# Patient Record
Sex: Male | Born: 1950 | Race: White | Hispanic: No | Marital: Single | Smoking: Current some day smoker
Health system: Southern US, Community
[De-identification: ages and names within clinical notes are randomized; demographics above are authoritative.]

## PROBLEM LIST (undated history)

## (undated) DIAGNOSIS — S32009A Unspecified fracture of unspecified lumbar vertebra, initial encounter for closed fracture: Secondary | ICD-10-CM

## (undated) HISTORY — PX: COLONOSCOPY: SHX174

## (undated) HISTORY — PX: VASECTOMY: SHX75

## (undated) HISTORY — PX: OTHER SURGICAL HISTORY: SHX169

## (undated) HISTORY — PX: HERNIA REPAIR: SHX51

## (undated) HISTORY — DX: Unspecified fracture of unspecified lumbar vertebra, initial encounter for closed fracture: S32.009A

---

## 1999-05-23 ENCOUNTER — Emergency Department (HOSPITAL_COMMUNITY): Admission: EM | Admit: 1999-05-23 | Discharge: 1999-05-23 | Payer: Self-pay | Admitting: Emergency Medicine

## 1999-05-24 ENCOUNTER — Ambulatory Visit (HOSPITAL_COMMUNITY): Admission: RE | Admit: 1999-05-24 | Discharge: 1999-05-24 | Payer: Self-pay

## 2001-12-20 ENCOUNTER — Encounter: Payer: Self-pay | Admitting: Family Medicine

## 2001-12-20 ENCOUNTER — Ambulatory Visit (HOSPITAL_COMMUNITY): Admission: RE | Admit: 2001-12-20 | Discharge: 2001-12-20 | Payer: Self-pay | Admitting: Family Medicine

## 2002-07-17 ENCOUNTER — Encounter (INDEPENDENT_AMBULATORY_CARE_PROVIDER_SITE_OTHER): Payer: Self-pay | Admitting: *Deleted

## 2002-07-17 ENCOUNTER — Ambulatory Visit (HOSPITAL_BASED_OUTPATIENT_CLINIC_OR_DEPARTMENT_OTHER): Admission: RE | Admit: 2002-07-17 | Discharge: 2002-07-17 | Payer: Self-pay

## 2004-02-04 ENCOUNTER — Emergency Department (HOSPITAL_COMMUNITY): Admission: EM | Admit: 2004-02-04 | Discharge: 2004-02-05 | Payer: Self-pay | Admitting: Emergency Medicine

## 2004-02-20 ENCOUNTER — Ambulatory Visit (HOSPITAL_COMMUNITY): Admission: RE | Admit: 2004-02-20 | Discharge: 2004-02-20 | Payer: Self-pay

## 2004-11-09 ENCOUNTER — Ambulatory Visit: Payer: Self-pay | Admitting: Internal Medicine

## 2004-12-29 ENCOUNTER — Ambulatory Visit: Payer: Self-pay | Admitting: Internal Medicine

## 2004-12-30 ENCOUNTER — Ambulatory Visit: Payer: Self-pay | Admitting: Internal Medicine

## 2009-03-24 ENCOUNTER — Inpatient Hospital Stay (HOSPITAL_COMMUNITY): Admission: EM | Admit: 2009-03-24 | Discharge: 2009-03-28 | Payer: Self-pay | Admitting: Emergency Medicine

## 2010-10-09 LAB — CBC
MCHC: 34.4 g/dL (ref 30.0–36.0)
MCHC: 34.7 g/dL (ref 30.0–36.0)
MCV: 91.3 fL (ref 78.0–100.0)
Platelets: 174 10*3/uL (ref 150–400)
Platelets: 190 10*3/uL (ref 150–400)
RBC: 4.08 MIL/uL — ABNORMAL LOW (ref 4.22–5.81)
RDW: 12.8 % (ref 11.5–15.5)
RDW: 12.9 % (ref 11.5–15.5)
WBC: 8.3 10*3/uL (ref 4.0–10.5)
WBC: 8.6 10*3/uL (ref 4.0–10.5)

## 2010-10-09 LAB — BASIC METABOLIC PANEL
BUN: 13 mg/dL (ref 6–23)
BUN: 16 mg/dL (ref 6–23)
CO2: 27 mEq/L (ref 19–32)
CO2: 27 mEq/L (ref 19–32)
Calcium: 8.9 mg/dL (ref 8.4–10.5)
Calcium: 8.9 mg/dL (ref 8.4–10.5)
Calcium: 9.1 mg/dL (ref 8.4–10.5)
Chloride: 105 mEq/L (ref 96–112)
Creatinine, Ser: 0.64 mg/dL (ref 0.4–1.5)
Creatinine, Ser: 0.73 mg/dL (ref 0.4–1.5)
GFR calc Af Amer: >60 mL/min (ref >60)
GFR calc non Af Amer: >60 mL/min (ref >60)
GFR calc non Af Amer: >60 mL/min (ref >60)
Glucose, Bld: 100 mg/dL — ABNORMAL HIGH (ref 70–99)
Glucose, Bld: 81 mg/dL (ref 70–99)
Potassium: 4.1 mEq/L (ref 3.5–5.1)
Sodium: 138 mEq/L (ref 135–145)

## 2010-10-09 LAB — PROTIME-INR: INR: 1 (ref 0.00–1.49)

## 2010-10-09 LAB — GLUCOSE, CAPILLARY: Glucose-Capillary: 79 mg/dL (ref 70–99)

## 2010-11-20 NOTE — Op Note (Signed)
NAME:  Joe Shannon, Joe Shannon                         ACCOUNT NO.:  [id number]   MEDICAL RECORD NO.:  [id number]                   PATIENT TYPE:  AMB   LOCATION:  DSC                                  FACILITY:  [facility]   PHYSICIAN:  Lorre Munroe., M.D.            DATE OF BIRTH:  1951/06/10   DATE OF PROCEDURE:  07/17/2002  DATE OF DISCHARGE:                                 OPERATIVE REPORT   PREOPERATIVE DIAGNOSIS:  Right inguinal hernia.   POSTOPERATIVE DIAGNOSES:  1. Indirect right inguinal hernia.  2. Hydrocele of the spermatic cord.   PROCEDURES:  1. Repair of right inguinal hernia.  2. Excision of hydrocele.   SURGEON:  Lebron Conners, M.D.   ANESTHESIA:  Local with sedation.   DESCRIPTION OF PROCEDURE:  After the patient was moderately sedated and had  routine preparation and draping of the right inguinal region, I liberally  infused local anesthetic in the area of the ilioinguinal nerve and in the  entire field.  I used more local anesthetic as I deepened my dissection, and  the patient evidently was comfortable throughout the procedure.  I made an  oblique skin incision beginning just above and lateral to the pubic tubercle  approximately 6 cm long, dissected down through the fat, getting hemostasis  with the Bovie, until I identified the external ring.  I opened the fibers  of the external oblique in through the external ring, taking great care to  avoid injury to the ilioinguinal and iliohypogastric nerve.  I exposed the  spermatic cord, which had a mass in the superior aspect of it.  I cut  cremaster fibers until I was able to encircle the cord with a Penrose drain,  and I elevated it and controlled it.  I then noted that there was no  evidence of a direct hernia.  I dissected the cord longitudinally toward the  mass, and I separated it from the vas deferens and the cord vessels.  It  appeared to be a fluid-filled sac, which was attached to tissues going up  through  the internal ring.  After dissecting it free of the vessels, I  clamped it and excised it and suture ligated the pedicle that it was on.  I  used 2-0 silk for the ligature.  I sent the specimen to the lab for  examination.  I felt that the internal ring was slightly lax, so I plugged  it with a small plug of polypropylene mesh.  I then fashioned a patch of  polypropylene mesh cut to fit the inguinal floor and a slit in it to allow  egress of the spermatic cord.  I sewed that in from the pubic tubercle  laterally and inferiorly with a running stitch in the inguinal ligament and  superiorly and medially with a running basting stitch in the fascia of the  internal oblique muscle.  I used 2-0 Prolene suture.  I used one stitch to  join the tails of the mesh together lateral to the spermatic cord and felt  that provided a very secure hernia repair.  I then infused a little bit more  local anesthetic and closed the external oblique and subcutaneous tissues  with separate layers of running 3-0 Vicryl suture.  I closed the skin with  intracuticular 4-0 Vicryl and Steri-Strips.  The patient tolerated the  operation well.                                                Lorre Munroe., M.D.   WB/MEDQ  D:  07/17/2002  T:  07/17/2002  Job:  [id number]

## 2010-11-20 NOTE — Op Note (Signed)
NAME:  Joe Shannon, OBERMAN                         ACCOUNT NO.:  [id number]   MEDICAL RECORD NO.:  [id number]                   PATIENT TYPE:  AMB   LOCATION:  DAY                                  FACILITY:  [facility]   PHYSICIAN:  Lorre Munroe., M.D.            DATE OF BIRTH:  02/14/1951   DATE OF PROCEDURE:  02/20/2004  DATE OF DISCHARGE:                                 OPERATIVE REPORT   PREOPERATIVE DIAGNOSES:  Indirect left inguinal hernia.   POSTOPERATIVE DIAGNOSES:  Indirect left inguinal hernia.   OPERATION:  Repair of left inguinal hernia.   SURGEON:  Lebron Conners, M.D.   ANESTHESIA:  General and local.   DESCRIPTION OF PROCEDURE:  After the patient was monitored and anesthetized  and had routine preparation and draping of the left lower abdomen, I made a  slightly oblique 5-6 cm incision from just above the pubic tubercle  laterally.  I dissected down through the fat until I encountered the  external oblique and identified the superficial inguinal ring. I opened the  external oblique into the superficial ring in the direction of its fibers.  There was a great deal of scar reaction present and it was difficult to  separate the ilioinguinal nerve from the scar. I made efforts to preserve  the ilioinguinal nerve but could not definitely see it throughout its course  along the cord and out into the soft tissues. After opening the ring, I  dissected the cord free of the underlying inguinal floor and controlled it  with a Penrose drain. The proximal part of the cord was greatly expanded and  there was an obvious indirect hernia.  The medial inguinal floor looked  strong and I did not think there was a direct hernia.  I dissected the  proximal part of the cord separating a large indirect hernia sac which again  was extremely scarred and very chronic in nature.  I separated it from the  vas deferens and cord vessels. I had a little bit of difficulty completely  dissecting it  free and dissecting into it at a couple of points and found  that it was a sliding hernia but I could not tell exactly which viscus was  making up part of the wall.  In any case after dissecting it free, I closed  the peritoneal defect with running 2-0 silk stitch and then reduced the  hernia sac and its contents through the deep ring. I also dissected up a  large lipoma and reduced it through the deep ring as well. I plugged the  superior aspect of the deep ring with a plug of polypropylene mesh held in  by 2-0 silk stitch.  I then fashioned a patch of polypropylene mesh and  sewed it in from the pubic tubercle medially and superiorly along the  internal oblique fascia with a running basting 2-0 Prolene stitch and  inferiorly and laterally with a  running 2-0 Prolene stitch in the shelving  edge of the inguinal ligament. I made a slit in the patch to allow exit of  the spermatic cord and joined the tails of the mesh together lateral to the  deep ring completing the repair.  I got good hemostasis with the cautery and  I thoroughly anesthetized the operative site and  blocked the ilioinguinal nerve. I closed the internal oblique and  subcutaneous tissues with separate running layers of 3-0 Vicryl and closed  the skin with intracuticular 4-0 Vicryl and Steri-Strips.  The patient  tolerated the operation well.  Sponge, needle and instrument counts were  correct.                                               Lorre Munroe., M.D.    WB/MEDQ  D:  02/20/2004  T:  02/20/2004  Job:  [id number]   cc:   Stacie Acres. White, M.D.  [address]  [place]  [place] [address]  Fax: [phone]

## 2015-07-16 DIAGNOSIS — K648 Other hemorrhoids: Secondary | ICD-10-CM | POA: Diagnosis not present

## 2015-07-16 DIAGNOSIS — D128 Benign neoplasm of rectum: Secondary | ICD-10-CM | POA: Diagnosis not present

## 2015-07-16 DIAGNOSIS — D126 Benign neoplasm of colon, unspecified: Secondary | ICD-10-CM | POA: Diagnosis not present

## 2015-07-16 DIAGNOSIS — K621 Rectal polyp: Secondary | ICD-10-CM | POA: Diagnosis not present

## 2015-07-16 DIAGNOSIS — Z800 Family history of malignant neoplasm of digestive organs: Secondary | ICD-10-CM | POA: Diagnosis not present

## 2015-07-16 DIAGNOSIS — Z86010 Personal history of colonic polyps: Secondary | ICD-10-CM | POA: Diagnosis not present

## 2015-07-16 DIAGNOSIS — D123 Benign neoplasm of transverse colon: Secondary | ICD-10-CM | POA: Diagnosis not present

## 2016-05-15 DIAGNOSIS — R69 Illness, unspecified: Secondary | ICD-10-CM | POA: Diagnosis not present

## 2016-06-09 DIAGNOSIS — R69 Illness, unspecified: Secondary | ICD-10-CM | POA: Diagnosis not present

## 2016-06-09 DIAGNOSIS — I10 Essential (primary) hypertension: Secondary | ICD-10-CM | POA: Diagnosis not present

## 2016-06-09 DIAGNOSIS — Z0000 Encounter for general adult medical examination without abnormal findings: Secondary | ICD-10-CM | POA: Diagnosis not present

## 2016-06-15 DIAGNOSIS — I10 Essential (primary) hypertension: Secondary | ICD-10-CM | POA: Diagnosis not present

## 2016-06-15 DIAGNOSIS — R69 Illness, unspecified: Secondary | ICD-10-CM | POA: Diagnosis not present

## 2016-06-15 DIAGNOSIS — Z049 Encounter for examination and observation for unspecified reason: Secondary | ICD-10-CM | POA: Diagnosis not present

## 2016-06-15 DIAGNOSIS — Z0000 Encounter for general adult medical examination without abnormal findings: Secondary | ICD-10-CM | POA: Diagnosis not present

## 2016-06-23 DIAGNOSIS — I10 Essential (primary) hypertension: Secondary | ICD-10-CM | POA: Diagnosis not present

## 2016-06-23 DIAGNOSIS — Z6821 Body mass index (BMI) 21.0-21.9, adult: Secondary | ICD-10-CM | POA: Diagnosis not present

## 2016-06-23 DIAGNOSIS — Z0000 Encounter for general adult medical examination without abnormal findings: Secondary | ICD-10-CM | POA: Diagnosis not present

## 2017-01-24 ENCOUNTER — Encounter: Payer: Self-pay | Admitting: Internal Medicine

## 2017-01-24 ENCOUNTER — Ambulatory Visit (INDEPENDENT_AMBULATORY_CARE_PROVIDER_SITE_OTHER): Payer: Medicare HMO | Admitting: Internal Medicine

## 2017-01-24 DIAGNOSIS — I10 Essential (primary) hypertension: Secondary | ICD-10-CM | POA: Insufficient documentation

## 2017-01-24 DIAGNOSIS — S32009A Unspecified fracture of unspecified lumbar vertebra, initial encounter for closed fracture: Secondary | ICD-10-CM | POA: Diagnosis not present

## 2017-01-24 DIAGNOSIS — Z1159 Encounter for screening for other viral diseases: Secondary | ICD-10-CM | POA: Diagnosis not present

## 2017-01-24 DIAGNOSIS — Z720 Tobacco use: Secondary | ICD-10-CM | POA: Insufficient documentation

## 2017-01-24 MED ORDER — LISINOPRIL-HYDROCHLOROTHIAZIDE 20-25 MG PO TABS: 1.0000 | ORAL_TABLET | Freq: Every day | ORAL | Status: DC

## 2017-01-24 MED ORDER — CENTRUM SILVER 50+MEN PO TABS: 1.0000 | ORAL_TABLET | Freq: Every day | ORAL | Status: AC

## 2017-01-24 MED ORDER — ASPIRIN EC 81 MG PO TBEC: 81.0000 mg | DELAYED_RELEASE_TABLET | Freq: Every day | ORAL | 4 refills | Status: AC

## 2017-01-24 MED ORDER — ASPIRIN EC 81 MG PO TBEC
81.0000 mg | DELAYED_RELEASE_TABLET | Freq: Every day | ORAL | 4 refills | Status: DC
Start: 2017-01-24 — End: 2017-01-24

## 2017-01-24 MED ORDER — AMLODIPINE BESYLATE 10 MG PO TABS: 10.0000 mg | ORAL_TABLET | Freq: Every day | ORAL | 2 refills | Status: DC

## 2017-01-24 NOTE — Assessment & Plan Note (Addendum)
60 pack years  Has not had Screen CT or an abdominal ultrasound  Will need a screen CT chest- order today  Will discuss quitting at next appointment

## 2017-01-24 NOTE — Assessment & Plan Note (Signed)
Uncontrolled - CMP14+EGFR - Lipid panel - CBC - amLODipine (NORVASC) 10 MG tablet; Take 1 tablet (10 mg total) by mouth daily.  Dispense: 30 tablet; Refill: 2  - Continue Lisinopril- HCTZ

## 2017-01-24 NOTE — Assessment & Plan Note (Signed)
-   HIV antibody - Hepatitis c antibody (reflex)

## 2017-01-24 NOTE — Progress Notes (Signed)
   [facility] [name], MD Phone: [phone]  Reason For Visit: Establish Care   # CHRONIC HTN: Reports diagnosis about 2-3 years ago with elevated bloood pressure  Current Meds - Lisinopril- HCTZ, Reports good compliance, took meds today. Tolerating well, w/o complaints. Lifestyle - Does a lot of walking,  Denies CP, dyspnea, HA, edema, dizziness / lightheadedness Indicates feeling dyspneic while climbing up the moutain - about 1/2 a mile   # Tobacco Abuse  - has smoked for a long time - 40 years - Patient is interested in quitting; will discuss at next appointment    Past Medical History Reviewed - hx of HTN as above and tobacco abuse  Reviewed problem list.  Medications- reviewed and updated No additions to family history Social history- patient is a current every day smoker  Objective: BP (!) 165/90 (BP Location: Left Arm, Patient Position: Sitting, Cuff Size: Normal)   Pulse 74   Temp 98.2 F (36.8 C) (Oral)   Ht '5\' 9"'$  (1.753 m)   Wt 171 lb 9.6 oz (77.8 kg)   SpO2 99%   BMI 25.34 kg/m  Gen: NAD, alert, cooperative with exam Cardio: regular rate and rhythm, S1S2 heard, no murmurs appreciated Pulm: clear to auscultation bilaterally, no wheezes, rhonchi or rales Extremities: warm, well perfused, No edema, cyanosis or clubbing;  MSK: Normal gait and station Skin: dry, intact, no rashes or lesions Neuro: Strength and sensation grossly intact   Assessment/Plan: See problem based a/p  Tobacco abuse 60 pack years  Has not had Screen CT or an abdominal ultrasound  Will need a screen CT chest- order today  Will discuss quitting at next appointment   Essential hypertension Uncontrolled - CMP14+EGFR - Lipid panel - CBC - amLODipine (NORVASC) 10 MG tablet; Take 1 tablet (10 mg total) by mouth daily.  Dispense: 30 tablet; Refill: 2  - Continue Lisinopril- HCTZ    Screening for viral disease - HIV antibody - Hepatitis c antibody  (reflex)

## 2017-01-24 NOTE — Patient Instructions (Signed)
I want follow up with me in about 1 week to go over labs and to check your prostate.

## 2017-01-25 LAB — CMP14+EGFR
ALK PHOS: 50 IU/L (ref 39–117)
ALT: 14 IU/L (ref 0–44)
AST: 17 IU/L (ref 0–40)
Albumin/Globulin Ratio: 2 (ref 1.2–2.2)
Albumin: 4.9 g/dL — ABNORMAL HIGH (ref 3.6–4.8)
BILIRUBIN TOTAL: 0.8 mg/dL (ref 0.0–1.2)
BUN/Creatinine Ratio: 13 (ref 10–24)
BUN: 9 mg/dL (ref 8–27)
CHLORIDE: 89 mmol/L — AB (ref 96–106)
CO2: 24 mmol/L (ref 20–29)
Calcium: 9.7 mg/dL (ref 8.6–10.2)
Creatinine, Ser: 0.69 mg/dL — ABNORMAL LOW (ref 0.76–1.27)
GFR calc Af Amer: 116 mL/min/{1.73_m2} (ref >59)
GFR calc non Af Amer: 100 mL/min/{1.73_m2} (ref >59)
GLUCOSE: 106 mg/dL — AB (ref 65–99)
Globulin, Total: 2.5 g/dL (ref 1.5–4.5)
Potassium: 4.4 mmol/L (ref 3.5–5.2)
Sodium: 132 mmol/L — ABNORMAL LOW (ref 134–144)
Total Protein: 7.4 g/dL (ref 6.0–8.5)

## 2017-01-25 LAB — CBC
Hematocrit: 39.5 % (ref 37.5–51.0)
Hemoglobin: 13.3 g/dL (ref 13.0–17.7)
MCH: 30.5 pg (ref 26.6–33.0)
MCHC: 33.7 g/dL (ref 31.5–35.7)
MCV: 91 fL (ref 79–97)
PLATELETS: 261 10*3/uL (ref 150–379)
RBC: 4.36 x10E6/uL (ref 4.14–5.80)
RDW: 13.8 % (ref 12.3–15.4)
WBC: 4.6 10*3/uL (ref 3.4–10.8)

## 2017-01-25 LAB — LIPID PANEL
CHOLESTEROL TOTAL: 212 mg/dL — AB (ref 100–199)
Chol/HDL Ratio: 3.3 ratio (ref 0.0–5.0)
HDL: 65 mg/dL (ref >39)
LDL Calculated: 131 mg/dL — ABNORMAL HIGH (ref 0–99)
Triglycerides: 78 mg/dL (ref 0–149)
VLDL CHOLESTEROL CAL: 16 mg/dL (ref 5–40)

## 2017-01-25 LAB — HCV COMMENT:

## 2017-01-25 LAB — HIV ANTIBODY (ROUTINE TESTING W REFLEX): HIV Screen 4th Generation wRfx: NONREACTIVE

## 2017-01-25 LAB — HEPATITIS C ANTIBODY (REFLEX): HCV Ab: <0.1 s/co ratio (ref 0.0–0.9)

## 2017-01-27 ENCOUNTER — Ambulatory Visit (HOSPITAL_COMMUNITY)
Admission: RE | Admit: 2017-01-27 | Discharge: 2017-01-27 | Disposition: A | Discharge status: Home / Self Care | Payer: Medicare HMO | Source: Ambulatory Visit | Attending: Family Medicine | Admitting: Family Medicine

## 2017-01-27 DIAGNOSIS — Z720 Tobacco use: Secondary | ICD-10-CM

## 2017-01-27 DIAGNOSIS — R938 Abnormal findings on diagnostic imaging of other specified body structures: Secondary | ICD-10-CM | POA: Insufficient documentation

## 2017-01-27 DIAGNOSIS — I2510 Atherosclerotic heart disease of native coronary artery without angina pectoris: Secondary | ICD-10-CM | POA: Insufficient documentation

## 2017-01-27 DIAGNOSIS — J439 Emphysema, unspecified: Secondary | ICD-10-CM | POA: Insufficient documentation

## 2017-01-27 DIAGNOSIS — I700 Atherosclerosis of aorta: Secondary | ICD-10-CM | POA: Insufficient documentation

## 2017-01-27 DIAGNOSIS — Z87891 Personal history of nicotine dependence: Secondary | ICD-10-CM | POA: Insufficient documentation

## 2017-02-03 ENCOUNTER — Ambulatory Visit (INDEPENDENT_AMBULATORY_CARE_PROVIDER_SITE_OTHER): Payer: Medicare HMO | Admitting: Internal Medicine

## 2017-02-03 VITALS — BP 134/92 | HR 90 | Temp 98.5°F | Ht 69.0 in | Wt 168.0 lb

## 2017-02-03 DIAGNOSIS — E785 Hyperlipidemia, unspecified: Secondary | ICD-10-CM | POA: Diagnosis not present

## 2017-02-03 DIAGNOSIS — Z716 Tobacco abuse counseling: Secondary | ICD-10-CM | POA: Diagnosis not present

## 2017-02-03 DIAGNOSIS — I10 Essential (primary) hypertension: Secondary | ICD-10-CM

## 2017-02-03 DIAGNOSIS — Z720 Tobacco use: Secondary | ICD-10-CM

## 2017-02-03 MED ORDER — ATORVASTATIN CALCIUM 20 MG PO TABS: 20.0000 mg | ORAL_TABLET | Freq: Every day | ORAL | 3 refills | Status: DC

## 2017-02-03 MED ORDER — NICOTINE 21 MG/24HR TD PT24: 21.0000 mg | MEDICATED_PATCH | Freq: Every day | TRANSDERMAL | 0 refills | Status: DC

## 2017-02-03 NOTE — Patient Instructions (Signed)
I want to start you on Lipitor for your cholesterol. I have also prescribe you a nicotine patch, which should help you come off your cigarettes. I want to follow-up with me in 1 month for blood pressure recheck and see how you're doing with smoking cessation.    Steps to Quit Smoking Smoking tobacco can be bad for your health. It can also affect almost every organ in your body. Smoking puts you and people around you at risk for many serious long-lasting (chronic) diseases. Quitting smoking is hard, but it is one of the best things that you can do for your health. It is never too late to quit. What are the benefits of quitting smoking? When you quit smoking, you lower your risk for getting serious diseases and conditions. They can include:  Lung cancer or lung disease.  Heart disease.  Stroke.  Heart attack.  Not being able to have children (infertility).  Weak bones (osteoporosis) and broken bones (fractures).  If you have coughing, wheezing, and shortness of breath, those symptoms may get better when you quit. You may also get sick less often. If you are pregnant, quitting smoking can help to lower your chances of having a baby of low birth weight. What can I do to help me quit smoking? Talk with your doctor about what can help you quit smoking. Some things you can do (strategies) include:  Quitting smoking totally, instead of slowly cutting back how much you smoke over a period of time.  Going to in-person counseling. You are more likely to quit if you go to many counseling sessions.  Using resources and support systems, such as: ? Database administrator with a Social worker. ? Phone quitlines. ? Careers information officer. ? Support groups or group counseling. ? Text messaging programs. ? Mobile phone apps or applications.  Taking medicines. Some of these medicines may have nicotine in them. If you are pregnant or breastfeeding, do not take any medicines to quit smoking unless your doctor  says it is okay. Talk with your doctor about counseling or other things that can help you.  Talk with your doctor about using more than one strategy at the same time, such as taking medicines while you are also going to in-person counseling. This can help make quitting easier. What things can I do to make it easier to quit? Quitting smoking might feel very hard at first, but there is a lot that you can do to make it easier. Take these steps:  Talk to your family and friends. Ask them to support and encourage you.  Call phone quitlines, reach out to support groups, or work with a Social worker.  Ask people who smoke to not smoke around you.  Avoid places that make you want (trigger) to smoke, such as: ? Bars. ? Parties. ? Smoke-break areas at work.  Spend time with people who do not smoke.  Lower the stress in your life. Stress can make you want to smoke. Try these things to help your stress: ? Getting regular exercise. ? Deep-breathing exercises. ? Yoga. ? Meditating. ? Doing a body scan. To do this, close your eyes, focus on one area of your body at a time from head to toe, and notice which parts of your body are tense. Try to relax the muscles in those areas.  Download or buy apps on your mobile phone or tablet that can help you stick to your quit plan. There are many free apps, such as QuitGuide from the CDC (  Centers for Disease Control and Prevention). You can find more support from smokefree.gov and other websites.  This information is not intended to replace advice given to you by your health care provider. Make sure you discuss any questions you have with your health care provider. Document Released: 04/17/2009 Document Revised: 02/17/2016 Document Reviewed: 11/05/2014 Elsevier Interactive Patient Education  2018 [employer].

## 2017-02-03 NOTE — Progress Notes (Signed)
   [facility] Joe Mo, MD Phone: [phone]  Reason For Visit: HTN and tobacco cessation   # CHRONIC HTN: Current Meds - Norvasc, Prinizide   Reports good compliance, took meds today. Tolerating well, w/o complaints. Denies CP, dyspnea, HA, edema, dizziness / lightheadedness   #Tobacco Cessation counseling  -  Pack and half day  - Has tried to quit before - Around a bunch of smokers - Room mate smokes a lot  - Quit for about 3 years ago, was back in 83/84  - Used Nictonine patches in the past, would like to try the gum  - No issues with breathing - able to walk up 2 flights of stairs, no chest pain  - No mood disorder    Past Medical History Reviewed problem list.  Medications- reviewed and updated No additions to family history Social history- patient is a smoker  Objective: BP (!) 134/92   Pulse 90   Temp 98.5 F (36.9 C) (Oral)   Ht 5\' 9"  (1.753 m)   Wt 168 lb (76.2 kg)   BMI 24.81 kg/m  Gen: NAD, alert, cooperative with exam Cardio: regular rate and rhythm, S1S2 heard, no murmurs appreciated Pulm: clear to auscultation bilaterally, no wheezes, rhonchi or rales Skin: dry, intact, no rashes or lesions   Assessment/Plan: See problem based a/p   Encounter for tobacco use cessation counseling Discussed options for tobacco cessation - not interested in medications currently, though no contraindications. Would like to start with patches  - Will start with high dose patch, and slowly decrease over the next couple months  - plan to quit over 3 months  -  Goal to start patch and be cold [place] - may add some nictonine gum along with patch  - Discussed using the quit line  - encouraged to try this .   Essential hypertension Blood pressure improved from last visit thought DBP still elevated Continue Norvasc and Prinizide for now   Hyperlipidemia ASCVD risk 22%  - Start Atorvastatin   - Continue 81 mg of Aspirin

## 2017-02-04 DIAGNOSIS — Z716 Tobacco abuse counseling: Secondary | ICD-10-CM | POA: Insufficient documentation

## 2017-02-04 NOTE — Assessment & Plan Note (Signed)
Discussed options for tobacco cessation - not interested in medications currently, though no contraindications. Would like to start with patches  - Will start with high dose patch, and slowly decrease over the next couple months  - plan to quit over 3 months  -  Goal to start patch and be cold [place] - may add some nictonine gum along with patch  - Discussed using the quit line  - encouraged to try this .

## 2017-02-04 NOTE — Assessment & Plan Note (Signed)
Blood pressure improved from last visit thought DBP still elevated Continue Norvasc and Prinizide for now

## 2017-02-04 NOTE — Assessment & Plan Note (Signed)
ASCVD risk 22%  - Start Atorvastatin   - Continue 81 mg of Aspirin

## 2017-03-08 ENCOUNTER — Encounter: Payer: Self-pay | Admitting: Internal Medicine

## 2017-03-08 ENCOUNTER — Ambulatory Visit (INDEPENDENT_AMBULATORY_CARE_PROVIDER_SITE_OTHER): Payer: Medicare HMO | Admitting: Internal Medicine

## 2017-03-08 DIAGNOSIS — I10 Essential (primary) hypertension: Secondary | ICD-10-CM

## 2017-03-08 DIAGNOSIS — N401 Enlarged prostate with lower urinary tract symptoms: Secondary | ICD-10-CM | POA: Diagnosis not present

## 2017-03-08 DIAGNOSIS — Z720 Tobacco use: Secondary | ICD-10-CM

## 2017-03-08 DIAGNOSIS — N400 Enlarged prostate without lower urinary tract symptoms: Secondary | ICD-10-CM | POA: Insufficient documentation

## 2017-03-08 DIAGNOSIS — E785 Hyperlipidemia, unspecified: Secondary | ICD-10-CM | POA: Diagnosis not present

## 2017-03-08 DIAGNOSIS — R3911 Hesitancy of micturition: Secondary | ICD-10-CM | POA: Diagnosis not present

## 2017-03-08 DIAGNOSIS — Z23 Encounter for immunization: Secondary | ICD-10-CM | POA: Diagnosis not present

## 2017-03-08 LAB — POCT UA - MICROSCOPIC ONLY

## 2017-03-08 LAB — POCT URINALYSIS DIP (MANUAL ENTRY)
Bilirubin, UA: NEGATIVE
Glucose, UA: NEGATIVE mg/dL
Ketones, POC UA: NEGATIVE mg/dL
Leukocytes, UA: NEGATIVE
Nitrite, UA: NEGATIVE
PROTEIN UA: NEGATIVE mg/dL
SPEC GRAV UA: 1.02 (ref 1.010–1.025)
UROBILINOGEN UA: 0.2 U/dL
pH, UA: 6 (ref 5.0–8.0)

## 2017-03-08 MED ORDER — ATORVASTATIN CALCIUM 20 MG PO TABS: 40.0000 mg | ORAL_TABLET | Freq: Every day | ORAL | 3 refills | Status: DC

## 2017-03-08 MED ORDER — FINASTERIDE 5 MG PO TABS: 5.0000 mg | ORAL_TABLET | Freq: Every day | ORAL | 2 refills | Status: DC

## 2017-03-08 MED ORDER — ATORVASTATIN CALCIUM 40 MG PO TABS: 40.0000 mg | ORAL_TABLET | Freq: Every day | ORAL | 3 refills | Status: DC

## 2017-03-08 NOTE — Patient Instructions (Signed)
Start taking 2 Lipitor pills.  I'm going to start you on finasteride for your prostate. This should help with your urinary frequency. However this will likely not occur immediately and will take about 6 months for affect to begin. Continue to work on smoking cessation during a great job. Please follow-up with me in 2-3 months.

## 2017-03-08 NOTE — Progress Notes (Signed)
   [facility] [name], MD Phone: [phone]  Reason For Visit: Tobacco Abuse/ HTN/ Frequent Urination   # Tobacco Abuse  - Has reduced is tobacco intake from 1.5 packs to .5 to 1 pack per day. Has been using nicotine patch - Still has trouble with social situations, still having difficulty with smoking when around friends.  # CHRONIC HTN Current Meds - Norvasc and Prinizide  Reports good compliance, took meds today. Tolerating well, w/o complaints. Lifestyle - trying to quite smoke  Denies  HA, edema, dizziness / lightheadedness  # Frequent urination  - Patient had is prostate checked a long time ago  - Urge to urinate, can not really urinate at times, at other times able to urinate.  - increased nocturia    Past Medical History Reviewed problem list.   Medications- reviewed and updated No additions to family history Social history- patient is a non -smoker  Objective: BP 120/78   Pulse 94   Temp 98.1 F (36.7 C) (Oral)   Wt 169 lb (76.7 kg)   SpO2 99%   BMI 24.96 kg/m  Gen: NAD, alert, cooperative with exam Cardio: regular rate and rhythm, S1S2 heard, no murmurs appreciated Pulm: clear to auscultation bilaterally, no wheezes, rhonchi or rales Skin: dry, intact, no rashes or lesions Rectal exam: smooth slightly enlarged prostate   Assessment/Plan: See problem based a/p  Essential hypertension Well controlled on Prinzide and Norvasc   BPH (benign prostatic hyperplasia) Likely BPH  - Will start finastride  - Will get PSA and UA    Tobacco abuse Continues to tobacco cessation  Continue nictonine patch

## 2017-03-08 NOTE — Assessment & Plan Note (Signed)
Likely BPH  - Will start finastride  - Will get PSA and UA

## 2017-03-08 NOTE — Assessment & Plan Note (Signed)
Well controlled on Prinzide and Norvasc

## 2017-03-08 NOTE — Assessment & Plan Note (Signed)
Continues to tobacco cessation  Continue nictonine patch

## 2017-03-09 LAB — PSA: PROSTATE SPECIFIC AG, SERUM: 1.5 ng/mL (ref 0.0–4.0)

## 2017-04-20 ENCOUNTER — Emergency Department (HOSPITAL_COMMUNITY)
Admission: EM | Admit: 2017-04-20 | Discharge: 2017-04-21 | Disposition: A | Discharge status: Home / Self Care | Payer: Medicare HMO | Attending: Emergency Medicine | Admitting: Emergency Medicine

## 2017-04-20 ENCOUNTER — Emergency Department (HOSPITAL_COMMUNITY): Payer: Medicare HMO

## 2017-04-20 ENCOUNTER — Encounter (HOSPITAL_COMMUNITY): Payer: Self-pay | Admitting: Emergency Medicine

## 2017-04-20 DIAGNOSIS — M7918 Myalgia, other site: Secondary | ICD-10-CM | POA: Insufficient documentation

## 2017-04-20 DIAGNOSIS — F17210 Nicotine dependence, cigarettes, uncomplicated: Secondary | ICD-10-CM | POA: Insufficient documentation

## 2017-04-20 DIAGNOSIS — I700 Atherosclerosis of aorta: Secondary | ICD-10-CM | POA: Diagnosis not present

## 2017-04-20 DIAGNOSIS — R918 Other nonspecific abnormal finding of lung field: Secondary | ICD-10-CM | POA: Diagnosis not present

## 2017-04-20 DIAGNOSIS — R9389 Abnormal findings on diagnostic imaging of other specified body structures: Secondary | ICD-10-CM | POA: Diagnosis not present

## 2017-04-20 DIAGNOSIS — R0602 Shortness of breath: Secondary | ICD-10-CM | POA: Insufficient documentation

## 2017-04-20 DIAGNOSIS — Z7982 Long term (current) use of aspirin: Secondary | ICD-10-CM | POA: Diagnosis not present

## 2017-04-20 DIAGNOSIS — I10 Essential (primary) hypertension: Secondary | ICD-10-CM | POA: Diagnosis not present

## 2017-04-20 DIAGNOSIS — M791 Myalgia, unspecified site: Secondary | ICD-10-CM | POA: Diagnosis not present

## 2017-04-20 DIAGNOSIS — E876 Hypokalemia: Secondary | ICD-10-CM | POA: Diagnosis not present

## 2017-04-20 DIAGNOSIS — M545 Low back pain: Secondary | ICD-10-CM | POA: Diagnosis present

## 2017-04-20 DIAGNOSIS — R05 Cough: Secondary | ICD-10-CM | POA: Diagnosis not present

## 2017-04-20 DIAGNOSIS — R069 Unspecified abnormalities of breathing: Secondary | ICD-10-CM | POA: Diagnosis not present

## 2017-04-20 DIAGNOSIS — R079 Chest pain, unspecified: Secondary | ICD-10-CM | POA: Diagnosis not present

## 2017-04-20 DIAGNOSIS — R69 Illness, unspecified: Secondary | ICD-10-CM | POA: Diagnosis not present

## 2017-04-20 DIAGNOSIS — Z79899 Other long term (current) drug therapy: Secondary | ICD-10-CM | POA: Insufficient documentation

## 2017-04-20 LAB — CBC WITH DIFFERENTIAL/PLATELET
Basophils Absolute: 0 10*3/uL (ref 0.0–0.1)
Basophils Relative: 0 %
Eosinophils Absolute: 0.1 10*3/uL (ref 0.0–0.7)
Eosinophils Relative: 1 %
HCT: 32.7 % — ABNORMAL LOW (ref 39.0–52.0)
Hemoglobin: 11.3 g/dL — ABNORMAL LOW (ref 13.0–17.0)
Lymphocytes Relative: 18 %
Lymphs Abs: 1.6 10*3/uL (ref 0.7–4.0)
MCH: 29.8 pg (ref 26.0–34.0)
MCHC: 34.6 g/dL (ref 30.0–36.0)
MCV: 86.3 fL (ref 78.0–100.0)
Monocytes Absolute: 0.8 10*3/uL (ref 0.1–1.0)
Monocytes Relative: 9 %
Neutro Abs: 6.6 10*3/uL (ref 1.7–7.7)
Neutrophils Relative %: 72 %
Platelets: 212 10*3/uL (ref 150–400)
RBC: 3.79 MIL/uL — ABNORMAL LOW (ref 4.22–5.81)
RDW: 12.6 % (ref 11.5–15.5)
WBC: 9 10*3/uL (ref 4.0–10.5)

## 2017-04-20 LAB — D-DIMER, QUANTITATIVE (NOT AT ARMC): D DIMER QUANT: 0.73 ug{FEU}/mL — AB (ref 0.00–0.50)

## 2017-04-20 MED ORDER — ALBUTEROL SULFATE (2.5 MG/3ML) 0.083% IN NEBU
5.0000 mg | INHALATION_SOLUTION | Freq: Once | RESPIRATORY_TRACT | Status: DC
  Filled 2017-04-20: qty 6

## 2017-04-20 MED ORDER — METHOCARBAMOL 500 MG PO TABS
500.0000 mg | ORAL_TABLET | Freq: Once | ORAL | Status: AC
  Administered 2017-04-20: 500 mg via ORAL
  Filled 2017-04-20: qty 1

## 2017-04-20 MED ORDER — OXYCODONE-ACETAMINOPHEN 5-325 MG PO TABS
1.0000 | ORAL_TABLET | Freq: Once | ORAL | Status: AC
  Administered 2017-04-20: 1 via ORAL
  Filled 2017-04-20: qty 1

## 2017-04-20 NOTE — ED Notes (Signed)
Pt stated he does not have shortness of breath at rest.

## 2017-04-20 NOTE — ED Provider Notes (Signed)
[facility] Provider Note   CSN: [id number] Arrival date & time: 04/20/17  2117     History   Chief Complaint Chief Complaint  Patient presents with  . Back Pain  . Shortness of Breath    HPI STRAN Joe [facility] is a 66 y.o. male.  Patient with a history of HTN, HLD with sudden onset of sharp, grabbing, intense pain in the right, lower lateral chest yesterday afternoon while riding in a car. The pain has been less intense but present since then. He denies SOB but reports it hurts to take a breath and "I have to take 3 or 4 breaths to make one good breath". No cough or fever. No abdominal pain, nausea or vomiting. The pain radiates to the posterior right shoulder.    The history is provided by the patient. No language interpreter was used.  Back Pain   Pertinent negatives include no chest pain, no fever and no abdominal pain.  Shortness of Breath  Pertinent negatives include no fever, no cough, no chest pain and no abdominal pain.    Past Medical History:  Diagnosis Date  . Closed lumbar vertebral fracture [facility])     Patient Active Problem List   Diagnosis Date Noted  . BPH (benign prostatic hyperplasia) 03/08/2017  . Encounter for tobacco use cessation counseling 02/04/2017  . Hyperlipidemia 02/03/2017  . Essential hypertension 01/24/2017  . Tobacco abuse 01/24/2017  . Lumbar verterbral fracture, traumatic 01/24/2017  . Screening for viral disease 01/24/2017    Past Surgical History:  Procedure Laterality Date  . COLONOSCOPY    . HERNIA REPAIR     Ligunial hernia, both sides   . VASECTOMY         Home Medications    Prior to Admission medications   Medication Sig Start Date End Date Taking? Authorizing Provider  amLODipine (NORVASC) 10 MG tablet Take 1 tablet (10 mg total) by mouth daily. 01/24/17  Yes Mikell, Jeani Sow, MD  aspirin EC 81 MG tablet Take 1 tablet (81 mg total) by mouth daily. 01/24/17  Yes Mikell, Jeani Sow, MD  atorvastatin (LIPITOR) 40 MG tablet Take 1 tablet (40 mg total) by mouth daily. 03/08/17  Yes Mikell, Jeani Sow, MD  finasteride (PROSCAR) 5 MG tablet Take 1 tablet (5 mg total) by mouth daily. 03/08/17  Yes Mikell, Jeani Sow, MD  lisinopril-hydrochlorothiazide (PRINZIDE,ZESTORETIC) 20-25 MG tablet Take 1 tablet by mouth daily. 01/24/17  Yes Mikell, Jeani Sow, MD  Multiple Vitamins-Minerals (CENTRUM SILVER 50+MEN) TABS Take 1 Syringe by mouth daily. 01/24/17  Yes Mikell, Jeani Sow, MD  vitamin B-12 (CYANOCOBALAMIN) 1000 MCG tablet Take 1,000 mcg by mouth daily.   Yes [provider]  nicotine (NICODERM CQ) 21 mg/24hr patch Place 1 patch (21 mg total) onto the skin daily. Patient not taking: Reported on 04/20/2017 02/03/17   Tonette Bihari, MD    Family History Family History  Problem Relation Age of Onset  . Heart disease Mother 59  . Hypertension Mother   . Colon cancer Mother   . Stroke Father 38  . Hypertension Father   . Diabetes Father     Social History Social History  Substance Use Topics  . Smoking status: Current Every Day Smoker    Packs/day: 1.50    Years: 40.00    Types: Cigarettes  . Smokeless tobacco: Never Used  . Alcohol use 2.4 - 3.0 oz/week    4 - 5 Cans of beer per  week     Allergies   Patient has no known allergies.   Review of Systems Review of Systems  Constitutional: Negative for diaphoresis and fever.  HENT: Negative.   Respiratory: Negative for cough and shortness of breath.        See HPI.  Cardiovascular: Negative for chest pain.  Gastrointestinal: Negative for abdominal pain and nausea.  Musculoskeletal: Positive for back pain.     Physical Exam Updated Vital Signs BP (!) 150/81 (BP Location: Right Arm)   Pulse 83   Temp 98.2 F (36.8 C) (Oral)   Resp 14   Ht 5\' 8"  (1.727 m)   Wt 74.8 kg (165 lb)   SpO2 97%   BMI 25.09 kg/m   Physical Exam  Constitutional: He is oriented to person, place, and  time. He appears well-developed and well-nourished.  HENT:  Head: Normocephalic.  Neck: Normal range of motion. Neck supple.  Cardiovascular: Normal rate and regular rhythm.   Pulmonary/Chest: Effort normal and breath sounds normal. He has no wheezes. He has no rales.  Abdominal: Soft. Bowel sounds are normal. There is no tenderness. There is no rebound and no guarding.  Musculoskeletal: Normal range of motion.       Arms: Tender to right lower lateral chest wall.   Neurological: He is alert and oriented to person, place, and time.  Skin: Skin is warm and dry. No rash noted.  Psychiatric: He has a normal mood and affect.     ED Treatments / Results  Labs (all labs ordered are listed, but only abnormal results are displayed) Labs Reviewed  D-DIMER, QUANTITATIVE (NOT AT [facility])  CBC WITH DIFFERENTIAL/PLATELET  COMPREHENSIVE METABOLIC PANEL  LIPASE, BLOOD    EKG  EKG Interpretation  Date/Time:  Wednesday April 20 2017 21:30:24 EDT Ventricular Rate:  80 PR Interval:    QRS Duration: 134 QT Interval:  402 QTC Calculation: 464 R Axis:   51 Text Interpretation:  Sinus rhythm Right bundle branch block Left ventricular hypertrophy Borderline ST elevation, lateral leads When compared to prior, no significant changes seen.  No STEMI Confirmed by Antony Blackbird [phone]) on 04/20/2017 10:11:48 PM       Radiology Dg Chest 2 View  Result Date: 04/20/2017 CLINICAL DATA:  Cough and shortness of Breath EXAM: CHEST  2 VIEW COMPARISON:  01/27/2017 FINDINGS: Cardiac shadow is within normal limits. The lungs are well aerated bilaterally. Minimal right basilar atelectasis is noted. No sizable effusion is seen. No bony abnormality is noted. Stable compression deformity in the lower thoracic spine is noted. IMPRESSION: Minimal right basilar atelectasis. Electronically Signed   By: Inez Catalina M.D.   On: 04/20/2017 21:57    Procedures Procedures (including critical care time)  Medications  Ordered in ED Medications  albuterol (PROVENTIL) (2.5 MG/3ML) 0.083% nebulizer solution 5 mg (not administered)     Initial Impression / Assessment and Plan / ED Course  I have reviewed the triage vital signs and the nursing notes.  Pertinent labs & imaging results that were available during my care of the patient were reviewed by me and considered in my medical decision making (see chart for details).  Clinical Course as of Apr 20 2299  Wed Apr 20, 2017  2203 ED EKG [FM]    Clinical Course User Index [FM] Payton Emerald, MD    Patient presents with sudden onset right lower lateral chest wall pain that is reproducible, and radiates to posterior right shoulder. No cough, fever. No specific SOB but  there is pleuritic discomfort.   CXR is essentially negative. Labs reassuring. Shows mild hypokalemia. Mildly elevated D-dimer. CT PE study ordered.  Pain is controlled. Patient is sleeping on recheck. VSS.  CT angio for PE is negative for PE. It does show a pleural density on the right and nodules, both that require follow up. Will cover with abx, provide pain relief and potassium supplementation. He is encouraged to follow up with PCP for abnormal CT results. Discussed the possibility of cancer with the patient and family.   EPIC email sent to PCP (Dr. Emmaline Life) regarding need for follow up on abnormal CT results.   Final Clinical Impressions(s) / ED Diagnoses   Final diagnoses:  None   1. Musculoskeletal pain 2. Hypokalemia 3. Abnormal CT chest 4. Tobacco abuse  New Prescriptions New Prescriptions   No medications on file     Charlann Lange, Hershal Coria 04/21/17 0355    Tegeler, Gwenyth Allegra, MD 04/21/17 [phone]

## 2017-04-20 NOTE — ED Triage Notes (Signed)
Pt arrived EMS from home with c/o back pain, rib pain, and SOB. Pt states that the sharp pain goes from his lower right rib/back and radiates to his neck.

## 2017-04-20 NOTE — ED Notes (Signed)
Patient transported to X-ray 

## 2017-04-21 ENCOUNTER — Emergency Department (HOSPITAL_COMMUNITY): Payer: Medicare HMO

## 2017-04-21 DIAGNOSIS — I700 Atherosclerosis of aorta: Secondary | ICD-10-CM | POA: Diagnosis not present

## 2017-04-21 DIAGNOSIS — R079 Chest pain, unspecified: Secondary | ICD-10-CM | POA: Diagnosis not present

## 2017-04-21 LAB — COMPREHENSIVE METABOLIC PANEL
ALT: 22 U/L (ref 17–63)
AST: 23 U/L (ref 15–41)
Albumin: 3.8 g/dL (ref 3.5–5.0)
Alkaline Phosphatase: 60 U/L (ref 38–126)
Anion gap: 11 (ref 5–15)
BUN: 6 mg/dL (ref 6–20)
CHLORIDE: 92 mmol/L — AB (ref 101–111)
CO2: 25 mmol/L (ref 22–32)
CREATININE: 0.59 mg/dL — AB (ref 0.61–1.24)
Calcium: 8.5 mg/dL — ABNORMAL LOW (ref 8.9–10.3)
GFR calc non Af Amer: >60 mL/min (ref >60)
Glucose, Bld: 94 mg/dL (ref 65–99)
POTASSIUM: 2.9 mmol/L — AB (ref 3.5–5.1)
SODIUM: 128 mmol/L — AB (ref 135–145)
Total Bilirubin: 0.5 mg/dL (ref 0.3–1.2)
Total Protein: 6.5 g/dL (ref 6.5–8.1)

## 2017-04-21 LAB — LIPASE, BLOOD: Lipase: 26 U/L (ref 11–51)

## 2017-04-21 MED ORDER — IOPAMIDOL (ISOVUE-370) INJECTION 76%
INTRAVENOUS | Status: AC
  Administered 2017-04-21: 100 mL
  Filled 2017-04-21: qty 100

## 2017-04-21 MED ORDER — METHOCARBAMOL 500 MG PO TABS: 500.0000 mg | ORAL_TABLET | Freq: Two times a day (BID) | ORAL | 0 refills | Status: DC

## 2017-04-21 MED ORDER — OXYCODONE-ACETAMINOPHEN 5-325 MG PO TABS: 1.0000 | ORAL_TABLET | ORAL | 0 refills | Status: DC | PRN

## 2017-04-21 MED ORDER — AZITHROMYCIN 250 MG PO TABS: 250.0000 mg | ORAL_TABLET | Freq: Every day | ORAL | 0 refills | Status: DC

## 2017-04-21 MED ORDER — POTASSIUM CHLORIDE ER 20 MEQ PO TBCR: 40.0000 meq | EXTENDED_RELEASE_TABLET | Freq: Every day | ORAL | 0 refills | Status: DC

## 2017-04-21 NOTE — Discharge Instructions (Signed)
Take medications as prescribed for pain. Return to the emergency department with any fever, severe/uncontrolled pain or new symptom of concern. Otherwise, call your doctor for any appointment for recheck.

## 2017-05-02 ENCOUNTER — Other Ambulatory Visit: Payer: Self-pay | Admitting: Internal Medicine

## 2017-05-02 DIAGNOSIS — I10 Essential (primary) hypertension: Secondary | ICD-10-CM

## 2017-07-07 ENCOUNTER — Other Ambulatory Visit: Payer: Self-pay | Admitting: Internal Medicine

## 2017-07-07 NOTE — Telephone Encounter (Signed)
Pt needs refill for lisinopril sent to [facility] on [address]

## 2017-07-08 MED ORDER — LISINOPRIL-HYDROCHLOROTHIAZIDE 20-25 MG PO TABS: 1.0000 | ORAL_TABLET | Freq: Every day | ORAL | Status: DC

## 2017-07-18 ENCOUNTER — Other Ambulatory Visit: Payer: Self-pay | Admitting: Internal Medicine

## 2017-07-18 MED ORDER — LISINOPRIL-HYDROCHLOROTHIAZIDE 20-25 MG PO TABS
1.0000 | ORAL_TABLET | Freq: Every day | ORAL | Status: DC
Start: 2017-07-18 — End: 2017-11-16

## 2017-07-18 NOTE — Telephone Encounter (Signed)
Please send Rx for lisonpril that was renewed 07-08-17 to [facility] on [address]

## 2017-07-25 ENCOUNTER — Other Ambulatory Visit: Payer: Self-pay | Admitting: Internal Medicine

## 2017-08-06 ENCOUNTER — Other Ambulatory Visit: Payer: Self-pay | Admitting: Internal Medicine

## 2017-08-06 DIAGNOSIS — I10 Essential (primary) hypertension: Secondary | ICD-10-CM

## 2017-08-08 NOTE — Telephone Encounter (Signed)
Please let patient know it is time for him to come in for check up

## 2017-11-16 ENCOUNTER — Other Ambulatory Visit: Payer: Self-pay

## 2017-11-16 ENCOUNTER — Encounter: Payer: Self-pay | Admitting: Internal Medicine

## 2017-11-16 ENCOUNTER — Ambulatory Visit (INDEPENDENT_AMBULATORY_CARE_PROVIDER_SITE_OTHER): Payer: Medicare HMO | Admitting: Internal Medicine

## 2017-11-16 VITALS — BP 148/90 | HR 67 | Temp 98.5°F | Ht 68.0 in | Wt 169.0 lb

## 2017-11-16 DIAGNOSIS — N401 Enlarged prostate with lower urinary tract symptoms: Secondary | ICD-10-CM

## 2017-11-16 DIAGNOSIS — I10 Essential (primary) hypertension: Secondary | ICD-10-CM

## 2017-11-16 DIAGNOSIS — Z720 Tobacco use: Secondary | ICD-10-CM

## 2017-11-16 DIAGNOSIS — R3911 Hesitancy of micturition: Secondary | ICD-10-CM

## 2017-11-16 DIAGNOSIS — D649 Anemia, unspecified: Secondary | ICD-10-CM | POA: Insufficient documentation

## 2017-11-16 MED ORDER — AMLODIPINE BESYLATE 10 MG PO TABS: 10.0000 mg | ORAL_TABLET | Freq: Every day | ORAL | 2 refills | Status: DC

## 2017-11-16 MED ORDER — LISINOPRIL-HYDROCHLOROTHIAZIDE 20-25 MG PO TABS: 1.0000 | ORAL_TABLET | Freq: Every day | ORAL | 2 refills | Status: DC

## 2017-11-16 MED ORDER — LISINOPRIL-HYDROCHLOROTHIAZIDE 20-25 MG PO TABS: 1.0000 | ORAL_TABLET | Freq: Every day | ORAL | Status: DC

## 2017-11-16 MED ORDER — ATORVASTATIN CALCIUM 40 MG PO TABS: 40.0000 mg | ORAL_TABLET | Freq: Every day | ORAL | 3 refills | Status: DC

## 2017-11-16 MED ORDER — FINASTERIDE 5 MG PO TABS: 5.0000 mg | ORAL_TABLET | Freq: Every day | ORAL | 2 refills | Status: DC

## 2017-11-16 NOTE — Assessment & Plan Note (Signed)
CBC notable for anemia.  Will check iron and ferritin levels.  Patient with a colonoscopy 2 years ago Dr. Paulita Fujita which had several polyps 5-year follow-up.  Discussed obtaining results from Dr. Paulita Fujita to place in system

## 2017-11-16 NOTE — Progress Notes (Signed)
   [facility] [name], MD Phone: [phone]  Reason For Visit: Follow up   # CHRONIC HTN: Patient reports being off of the right prinizide 1 week. Has been taking Prinizide  Current Meds - Norvasc and Prinzide  Reports good compliance, Tolerating well, w/o complaints. Lifestyle - Just got a treadmill  Denies CP, dyspnea, HA, edema, dizziness / lightheadedness  #Anemia  Denies any symptoms of anemia, any palpitations or shortness of breath.  States that he has had a colonoscopy 2 years ago which noted some polyps, 5 year follow-up colonoscopy  No history of rectal bleeding, bloody stools, dark brown stools   #BPH Currently well controlled with finasteride.  Patient states that he gets up to the bathroom twice a night.  No issues with urinating.  Past Medical History Reviewed problem list.  Medications- reviewed and updated No additions to family history Social history- patient is smoker  Objective: BP (!) 148/90   Pulse 67   Temp 98.5 F (36.9 C) (Oral)   Ht 5\' 8"  (1.727 m)   Wt 169 lb (76.7 kg)   SpO2 97%   BMI 25.70 kg/m  Gen: NAD, alert, cooperative with exam HEENT: Normal    Neck: No masses palpated. No lymphadenopathy    Ears: Tympanic membranes intact, normal light reflex, no erythema, no bulging    Nose: nasal turbinates moist    Throat: moist mucus membranes, no erythema Cardio: regular rate and rhythm, S1S2 heard, no murmurs appreciated Pulm: clear to auscultation bilaterally, no wheezes, rhonchi or rales GI: soft, non-tender, non-distended, bowel sounds present, no hepatomegaly, no splenomegaly Extremities: warm, well perfused, No edema, cyanosis or clubbing;  MSK: Normal gait and station Skin: dry, intact, no rashes or lesions Neuro: Strength and sensation grossly intact  Assessment/Plan: See problem based a/p  No problem-specific Assessment & Plan notes found for this encounter.

## 2017-11-16 NOTE — Assessment & Plan Note (Signed)
Blood pressure not well controlled today likely due to patient being out of medication.  Will refill Prinzide.  Will refill Norvasc.  Follow-up in 2 months.

## 2017-11-16 NOTE — Patient Instructions (Signed)
I have refilled your medications. If there are any concerns on your work up I will give a call otherwise you should get the results in the mail. Please follow up every  6 months.

## 2017-11-16 NOTE — Assessment & Plan Note (Signed)
Previously struggling with tobacco use plans to quit.  Quit about 3 months and then restarted.  Plans to try to quit again

## 2017-11-16 NOTE — Assessment & Plan Note (Signed)
Well controlled,refill Proscar

## 2017-11-17 ENCOUNTER — Encounter: Payer: Self-pay | Admitting: Internal Medicine

## 2017-11-17 LAB — BASIC METABOLIC PANEL
BUN/Creatinine Ratio: 18 (ref 10–24)
BUN: 13 mg/dL (ref 8–27)
CALCIUM: 9.6 mg/dL (ref 8.6–10.2)
CHLORIDE: 97 mmol/L (ref 96–106)
CO2: 22 mmol/L (ref 20–29)
Creatinine, Ser: 0.73 mg/dL — ABNORMAL LOW (ref 0.76–1.27)
GFR, EST AFRICAN AMERICAN: 113 mL/min/{1.73_m2} (ref >59)
GFR, EST NON AFRICAN AMERICAN: 97 mL/min/{1.73_m2} (ref >59)
Glucose: 94 mg/dL (ref 65–99)
POTASSIUM: 4.2 mmol/L (ref 3.5–5.2)
SODIUM: 135 mmol/L (ref 134–144)

## 2017-11-17 LAB — CBC
HEMOGLOBIN: 14.1 g/dL (ref 13.0–17.7)
Hematocrit: 41 % (ref 37.5–51.0)
MCH: 30.9 pg (ref 26.6–33.0)
MCHC: 34.4 g/dL (ref 31.5–35.7)
MCV: 90 fL (ref 79–97)
PLATELETS: 262 10*3/uL (ref 150–379)
RBC: 4.57 x10E6/uL (ref 4.14–5.80)
RDW: 14.1 % (ref 12.3–15.4)
WBC: 6.5 10*3/uL (ref 3.4–10.8)

## 2017-11-17 LAB — IRON AND TIBC
Iron Saturation: 25 % (ref 15–55)
Iron: 88 ug/dL (ref 38–169)
TIBC: 359 ug/dL (ref 250–450)
UIBC: 271 ug/dL (ref 111–343)

## 2017-11-17 LAB — FERRITIN: FERRITIN: 303 ng/mL (ref 30–400)

## 2017-12-24 ENCOUNTER — Encounter

## 2018-01-18 DIAGNOSIS — R69 Illness, unspecified: Secondary | ICD-10-CM | POA: Diagnosis not present

## 2018-01-18 DIAGNOSIS — N400 Enlarged prostate without lower urinary tract symptoms: Secondary | ICD-10-CM | POA: Diagnosis not present

## 2018-01-18 DIAGNOSIS — Z823 Family history of stroke: Secondary | ICD-10-CM | POA: Diagnosis not present

## 2018-01-18 DIAGNOSIS — Z833 Family history of diabetes mellitus: Secondary | ICD-10-CM | POA: Diagnosis not present

## 2018-01-18 DIAGNOSIS — Z7982 Long term (current) use of aspirin: Secondary | ICD-10-CM | POA: Diagnosis not present

## 2018-01-18 DIAGNOSIS — E785 Hyperlipidemia, unspecified: Secondary | ICD-10-CM | POA: Diagnosis not present

## 2018-01-18 DIAGNOSIS — Z809 Family history of malignant neoplasm, unspecified: Secondary | ICD-10-CM | POA: Diagnosis not present

## 2018-01-18 DIAGNOSIS — I10 Essential (primary) hypertension: Secondary | ICD-10-CM | POA: Diagnosis not present

## 2018-01-18 DIAGNOSIS — Z8249 Family history of ischemic heart disease and other diseases of the circulatory system: Secondary | ICD-10-CM | POA: Diagnosis not present

## 2018-03-28 NOTE — Progress Notes (Deleted)
   Subjective:   Patient ID: Joe [facility]    DOB: 31-Jul-1950, 67 y.o. male   MRN: [id number]  Joe Shannon is a 67 y.o. male with a history of HTN, anemia, hyperlipidemia, and tobacco abuse here for bi-annual check up.  Concerns for today:   # CHRONIC HTN: Blood pressure today: Current Meds - Norvasc and Prinzide  Reports good compliance, Tolerating well, w/o complaints. Lifestyle - Just got a treadmill  Denies CP, dyspnea, HA, edema, dizziness / lightheadedness  #Anemia  Denies any symptoms of anemia, any palpitations or shortness of breath.  Labs from May 2019: Hemoglobin (14.1 up from 11.3), Iron studies and ferritin WNL  States that he has had a colonoscopy 2 years ago which noted some polyps, 5 year follow-up colonoscopy recommended  (try to get records) No history of rectal bleeding, bloody stools, dark brown stools, hematuria, hematemesis  Trace blood seen on UA in 03/2017   #BPH Currently well controlled with finasteride.  Patient states that he gets up to the bathroom twice a night.  No issues with urinating.  #Tobacco Abuse CT angiogram in 2018: Two subpleural nodules are seen within the lingula. Non-contrast chest CT at 6-12 months from scan was recommended  Has reduced his tobacco intake from 1.5 packs to .5 to 1 pack per day. Has been using nicotine patch - Still has trouble with social situations, still having difficulty with smoking when around friends.  #Hyperlipidemia: Curretnly taking Lipitor 40mg  QD Medication compliance: RUQ pain? Muscle aches?  #Health Maintenance: -due for Pneumonia vaccine and Flu vaccine   Past Medical History Reviewed problem list.  Medications- reviewed and updated No additions to family history Social history- patient is smoker  Review of Systems:  Per HPI.   [place], medications and smoking status reviewed.  Objective:   There were no vitals taken for this visit. Vitals and nursing note reviewed.  General: well  nourished, well developed, in no acute distress with non-toxic appearance HEENT: normocephalic, atraumatic, moist mucous membranes Neck: supple, non-tender without lymphadenopathy CV: regular rate and rhythm without murmurs, rubs, or gallops, no lower extremity edema Lungs: clear to auscultation bilaterally with normal work of breathing Abdomen: soft, non-tender, non-distended, no masses or organomegaly palpable, normoactive bowel sounds Skin: warm, dry, no rashes or lesions Extremities: warm and well perfused, normal tone MSK: ROM grossly intact, strength intact, gait normal Neuro: Alert and oriented, speech normal  Assessment & Plan:   No problem-specific Assessment & Plan notes found for this encounter.  No orders of the defined types were placed in this encounter.  No orders of the defined types were placed in this encounter.  Anemia Appears to be resolved according to last lab work in May 2019 Repeat POCT UA? Due to presence of trace blood and long smoking history  Will continue to monitor --repeat CBC?   Essential hypertension Well controlled on Prinzide and Norvasc   Tobacco abuse 60 pack years  Two subpleural nodules seen within lingula on last CT (04/2017)  Per recommendations, Non-contrast chest CT ordered today  Continues to tobacco cessation  Continue nictonine patch   Hyperlipidemia Continue Lipitor 40 mg QD  Lipid panel ordered today  BPH: -Well controlled -Continue Finasteride  Health Maintenance: Will request for colonoscopy records Flu and PNA givent oday  Joe Marble, DO PGY-1, [facility] 03/28/2018 5:58 PM

## 2018-04-06 ENCOUNTER — Ambulatory Visit: Payer: Medicare HMO | Admitting: Family Medicine

## 2018-05-08 NOTE — Progress Notes (Signed)
Subjective:   Patient ID: Joe [facility]    DOB: 11-22-1950, 67 y.o. male   MRN: [id number]  Joe Shannon is a 67 y.o. male with a history of HTN, HLD, and BPH here for his check up and flu vaccine.  New Concerns today: none  1. Chronic HTN: Current Meds - Norvasc 10mg  and Prinzide (Lisinopril-HCTZ 20-25) BP today: 118/72  (baseline: 481-856'D systollically) Last visit in May, BP was elevated due to being out of medicine. Normotensive today. Reports good compliance, Tolerating well, w/o complaints. Denies CP, dyspnea, HA, edema, dizziness / lightheadedness  2. Anemia: Anemia noted at last visit. Will continue to monitor  Repeat CBC significant for Hgb: 14.1 (previously 11.3), 90. Iron and Ferritin all WNL.  Patient with a colonoscopy 2 years ago (Jan 2018) with Dr. Paulita Fujita at [facility] which had several polyps with recommended 5-year follow-up.   Denies any symptoms of anemia, any palpitations or shortness of breath. Denies any hematuria, hematechezia, hematemesis, melena.  3. BPH Currently well controlled with finasteride.  Patient states that he gets up to the bathroom 2-3x a night and this is bothersome.  No issues with urinating. Does admit to sometimes feeling he is done urinating but then more comes out.   4. HLD:  On Atorvastatin 40mg  and ASA 81mg  Last lipid panel 01/24/18: LDL 131, cholesterol 212, HDL 65 Reports good compliance, Tolerating well, w/o complaints.  5. Tobacco Abuse Counceling: -  Smokes Pack and half day  - Has tried to quit before. Was trying Vaping and Nicotine patches which was helping, but just "likes to smoke". - Around a bunch of smokers - Room mate smokes a lot  - Quit for about 3 years, was back in66 [phone]  - No issues with breathing - able to walk up 2 flights of stairs, no chest pain  - No mood disorder   6. Multiple Pulmonary Nodules - Incidental finding - Screen CT: Lung-RADS 2, benign appearance or behavior. Continue annual screening  with low-dose chest CT without contrast in 12 months. - CTA in 04/2017:  Two subpleural nodules seen within the lingula, one is stable since prior low-dose chest CT measuring ~2 mm and the other is new measuring 8.1 mm in average.   7. Health Maintenance: Due for flu vaccine, PNA vaccine, and AAA screen  Review of Systems:  Per HPI.   [place], medications and smoking status reviewed.  Objective:   BP 118/72   Pulse 84   Temp 98.6 F (37 C) (Oral)   Ht 5\' 8"  (1.727 m)   Wt 167 lb 6.4 oz (75.9 kg)   SpO2 97%   BMI 25.45 kg/m  Vitals and nursing note reviewed.  General: well nourished, well developed, in no acute distress with non-toxic appearance HEENT: normocephalic, atraumatic, moist mucous membranes Neck: supple, non-tender without lymphadenopathy CV: regular rate and rhythm without murmurs, rubs, or gallops, no lower extremity edema Lungs: clear to auscultation bilaterally with normal work of breathing Abdomen: soft, non-tender, non-distended, no masses or organomegaly palpable, normoactive bowel sounds Skin: warm, dry, no rashes or lesions Extremities: warm and well perfused, normal tone Neuro: Alert and oriented, speech normal  Assessment & Plan:   Essential hypertension Normotensive today. Denies any problems with medications. Continue Norvasc and Prinzide  Anemia  According to last CBC, appears to have self resolved. Anemia panel WNL.   Discussed obtaining results from Dr. Paulita Fujita to place in system. Will send over form to get results  Will continue  to monitor  BPH (benign prostatic hyperplasia) Doing well on Finasteride. Due to symptoms of incomplete voiding, will add on Flomax to see if that helps. Will reassess at next follow-up.   Hyperlipidemia  ASCVD risk 22%. Patient on Atorvastatin 40 and ASA 81mg  QD, started in 2018.  Notes compliance and denies any side effects.  Will obtain lipid panel to assess effectiveness.   Encounter for tobacco use  cessation counseling Patient notes enjoying smoking He is open to cutting back After discussion, goal is to cut down from 1.5 packs/day to 0.5 packs/day in 1-2 months with the help of vaping and nicotine patches Will reassess in 4-5 months at follow-up visit  Pulmonary nodules CTA in 04/2017 found 2 nodules, once new from prior low dose CT. Will obtain follow-up imaging Will order non-contrast chest CT. If stable, then will continue follow-up imaging yearly  RTC in 4-5 months to go over results   Health Maintenance: - Flu and PNA shot given today - AAA ultrasound and CT chest without contrast ordered - will obtain records from GI concerning prior colonoscopy  Orders Placed This Encounter  Procedures  . US ABDOMINAL AORTA SCREENING AAA    Standing Status:   Future    Standing Expiration Date:   07/10/2019    Order Specific Question:   Reason for Exam (SYMPTOM  OR DIAGNOSIS REQUIRED)    Answer:   Screening, long history of tobacco use    Order Specific Question:   Preferred imaging location?    Answer:   [place] UP LOW DOSE W/O    Standing Status:   Future    Standing Expiration Date:   07/10/2019    Order Specific Question:   ** REASON FOR EXAM (FREE TEXT)    Answer:   Follow up pulmonary nodules findings on prior CT    Order Specific Question:   Preferred imaging location?    Answer:   [facility]    Order Specific Question:   Radiology Contrast Protocol - do NOT remove file path    Answer:   [email or web address]  . Flu Vaccine QUAD 36+ mos IM  . Pneumococcal conjugate vaccine 13-valent  . Lipid Panel   Meds ordered this encounter  Medications  . tamsulosin (FLOMAX) 0.4 MG CAPS capsule    Sig: Take 1 capsule (0.4 mg total) by mouth daily.    Dispense:  30 capsule    Refill:  [place], DO PGY-1, [facility] 05/09/2018 6:04 PM

## 2018-05-09 ENCOUNTER — Encounter: Payer: Self-pay | Admitting: Family Medicine

## 2018-05-09 ENCOUNTER — Other Ambulatory Visit: Payer: Self-pay

## 2018-05-09 ENCOUNTER — Ambulatory Visit (INDEPENDENT_AMBULATORY_CARE_PROVIDER_SITE_OTHER): Payer: Medicare HMO | Admitting: Family Medicine

## 2018-05-09 VITALS — BP 118/72 | HR 84 | Temp 98.6°F | Ht 68.0 in | Wt 167.4 lb

## 2018-05-09 DIAGNOSIS — I10 Essential (primary) hypertension: Secondary | ICD-10-CM

## 2018-05-09 DIAGNOSIS — R918 Other nonspecific abnormal finding of lung field: Secondary | ICD-10-CM

## 2018-05-09 DIAGNOSIS — R350 Frequency of micturition: Secondary | ICD-10-CM

## 2018-05-09 DIAGNOSIS — Z136 Encounter for screening for cardiovascular disorders: Secondary | ICD-10-CM | POA: Diagnosis not present

## 2018-05-09 DIAGNOSIS — Z122 Encounter for screening for malignant neoplasm of respiratory organs: Secondary | ICD-10-CM

## 2018-05-09 DIAGNOSIS — D649 Anemia, unspecified: Secondary | ICD-10-CM

## 2018-05-09 DIAGNOSIS — E785 Hyperlipidemia, unspecified: Secondary | ICD-10-CM | POA: Diagnosis not present

## 2018-05-09 DIAGNOSIS — Z23 Encounter for immunization: Secondary | ICD-10-CM

## 2018-05-09 DIAGNOSIS — Z716 Tobacco abuse counseling: Secondary | ICD-10-CM | POA: Diagnosis not present

## 2018-05-09 DIAGNOSIS — N401 Enlarged prostate with lower urinary tract symptoms: Secondary | ICD-10-CM

## 2018-05-09 MED ORDER — TAMSULOSIN HCL 0.4 MG PO CAPS: 0.4000 mg | ORAL_CAPSULE | Freq: Every day | ORAL | 3 refills | Status: DC

## 2018-05-09 NOTE — Assessment & Plan Note (Signed)
CTA in 04/2017 found 2 nodules, once new from prior low dose CT. Will obtain follow-up imaging Will order non-contrast chest CT. If stable, then will continue follow-up imaging yearly  RTC in 4-5 months to go over results

## 2018-05-09 NOTE — Assessment & Plan Note (Signed)
Patient notes enjoying smoking He is open to cutting back After discussion, goal is to cut down from 1.5 packs/day to 0.5 packs/day in 1-2 months with the help of vaping and nicotine patches Will reassess in 4-5 months at follow-up visit

## 2018-05-09 NOTE — Assessment & Plan Note (Signed)
Normotensive today. Denies any problems with medications. Continue Norvasc and Prinzide

## 2018-05-09 NOTE — Assessment & Plan Note (Signed)
   According to last CBC, appears to have self resolved. Anemia panel WNL.   Discussed obtaining results from Dr. Paulita Fujita to place in system. Will send over form to get results  Will continue to monitor

## 2018-05-09 NOTE — Patient Instructions (Signed)
Thank you for coming to see me today. It was a pleasure. Today we talked about:   Blood pressure was good today. Please continue your medications as prescirbed.  We will reach out to your GI doctor to obtain your colonoscopy results.  For your prostate, we will add Flomax (Tamsulosin) 0.4 mg. Take 1 pill a day in addition to your Finasteride.  Today we also dicussed your tobacco use. We came up with a plan to cut down to a 1/2 pack and we have aimed to do this in 1-2 months with the help of nicotine patch and vape.  We have referred you for a ultrasound of your abdominal aorta and a repeat CT scan. Please return in February or March to go over results and see how these changes are working.  Please follow-up with me in 4-5 months for a follow up.  If you have any questions or concerns, please do not hesitate to call the office at [phone].  Take Care,   Dr. Mina Marble, DO Resident Physician [place] [phone]

## 2018-05-09 NOTE — Assessment & Plan Note (Signed)
Doing well on Finasteride. Due to symptoms of incomplete voiding, will add on Flomax to see if that helps. Will reassess at next follow-up.

## 2018-05-09 NOTE — Assessment & Plan Note (Addendum)
   ASCVD risk 22%. Patient on Atorvastatin 40 and ASA 81mg  QD, started in 2018.  Notes compliance and denies any side effects.  Will obtain lipid panel to assess effectiveness.

## 2018-05-10 LAB — LIPID PANEL
Chol/HDL Ratio: 2.7 ratio (ref 0.0–5.0)
Cholesterol, Total: 166 mg/dL (ref 100–199)
HDL: 61 mg/dL (ref >39)
LDL CALC: 89 mg/dL (ref 0–99)
TRIGLYCERIDES: 79 mg/dL (ref 0–149)
VLDL Cholesterol Cal: 16 mg/dL (ref 5–40)

## 2018-05-14 ENCOUNTER — Encounter: Payer: Self-pay | Admitting: Family Medicine

## 2018-05-22 ENCOUNTER — Ambulatory Visit (HOSPITAL_COMMUNITY): Payer: Medicare HMO

## 2018-05-22 ENCOUNTER — Ambulatory Visit (HOSPITAL_COMMUNITY)
Admission: RE | Admit: 2018-05-22 | Discharge: 2018-05-22 | Disposition: A | Discharge status: Home / Self Care | Payer: Medicare HMO | Source: Ambulatory Visit | Attending: Family Medicine | Admitting: Family Medicine

## 2018-05-22 DIAGNOSIS — Z136 Encounter for screening for cardiovascular disorders: Secondary | ICD-10-CM | POA: Diagnosis not present

## 2018-05-22 DIAGNOSIS — Z87891 Personal history of nicotine dependence: Secondary | ICD-10-CM | POA: Insufficient documentation

## 2018-05-23 ENCOUNTER — Encounter: Payer: Self-pay | Admitting: Family Medicine

## 2018-05-24 ENCOUNTER — Telehealth: Payer: Self-pay | Admitting: *Deleted

## 2018-05-24 NOTE — Telephone Encounter (Signed)
CT scan had to be approved by insurance. It was approved by them as of yesterday. He needs to have the CT scan scheduled. Would you mind scheduling this for him? Thank you!

## 2018-05-24 NOTE — Telephone Encounter (Signed)
Pts CT was never scheduled.  He went for his ultrasound today but needs the CT scheduled. Aailyah Dunbar, Salome Spotted, CMA

## 2018-05-25 ENCOUNTER — Telehealth: Payer: Self-pay

## 2018-05-25 NOTE — Telephone Encounter (Signed)
Scheduled CT for patient at [facility] on 05/2518 at 1000 with show time of 0945.  Called and left voice message for patient.  Joe Shannon, [place]

## 2018-05-29 ENCOUNTER — Other Ambulatory Visit: Payer: Self-pay | Admitting: Family Medicine

## 2018-05-29 ENCOUNTER — Ambulatory Visit (HOSPITAL_COMMUNITY): Admission: RE | Admit: 2018-05-29 | Payer: Medicare HMO | Source: Ambulatory Visit

## 2018-05-29 ENCOUNTER — Ambulatory Visit (HOSPITAL_COMMUNITY)
Admission: RE | Admit: 2018-05-29 | Discharge: 2018-05-29 | Disposition: A | Discharge status: Home / Self Care | Payer: Medicare HMO | Source: Ambulatory Visit | Attending: Family Medicine | Admitting: Family Medicine

## 2018-05-29 DIAGNOSIS — J439 Emphysema, unspecified: Secondary | ICD-10-CM | POA: Diagnosis not present

## 2018-05-29 DIAGNOSIS — Z122 Encounter for screening for malignant neoplasm of respiratory organs: Secondary | ICD-10-CM | POA: Diagnosis not present

## 2018-05-29 DIAGNOSIS — R918 Other nonspecific abnormal finding of lung field: Secondary | ICD-10-CM | POA: Diagnosis not present

## 2018-07-26 DIAGNOSIS — Z833 Family history of diabetes mellitus: Secondary | ICD-10-CM | POA: Diagnosis not present

## 2018-07-26 DIAGNOSIS — Z8249 Family history of ischemic heart disease and other diseases of the circulatory system: Secondary | ICD-10-CM | POA: Diagnosis not present

## 2018-07-26 DIAGNOSIS — Z823 Family history of stroke: Secondary | ICD-10-CM | POA: Diagnosis not present

## 2018-07-26 DIAGNOSIS — Z7982 Long term (current) use of aspirin: Secondary | ICD-10-CM | POA: Diagnosis not present

## 2018-07-26 DIAGNOSIS — R69 Illness, unspecified: Secondary | ICD-10-CM | POA: Diagnosis not present

## 2018-07-26 DIAGNOSIS — Z809 Family history of malignant neoplasm, unspecified: Secondary | ICD-10-CM | POA: Diagnosis not present

## 2018-07-26 DIAGNOSIS — N400 Enlarged prostate without lower urinary tract symptoms: Secondary | ICD-10-CM | POA: Diagnosis not present

## 2018-07-26 DIAGNOSIS — I10 Essential (primary) hypertension: Secondary | ICD-10-CM | POA: Diagnosis not present

## 2018-07-26 DIAGNOSIS — E785 Hyperlipidemia, unspecified: Secondary | ICD-10-CM | POA: Diagnosis not present

## 2018-07-27 ENCOUNTER — Encounter: Payer: Self-pay | Admitting: Family Medicine

## 2018-09-08 ENCOUNTER — Other Ambulatory Visit: Payer: Self-pay

## 2018-09-08 DIAGNOSIS — I10 Essential (primary) hypertension: Secondary | ICD-10-CM

## 2018-09-08 MED ORDER — LISINOPRIL-HYDROCHLOROTHIAZIDE 20-25 MG PO TABS: 1.0000 | ORAL_TABLET | Freq: Every day | ORAL | 2 refills | Status: DC

## 2018-12-08 ENCOUNTER — Other Ambulatory Visit: Payer: Self-pay | Admitting: *Deleted

## 2018-12-08 DIAGNOSIS — I10 Essential (primary) hypertension: Secondary | ICD-10-CM

## 2018-12-08 MED ORDER — AMLODIPINE BESYLATE 10 MG PO TABS: 10.0000 mg | ORAL_TABLET | Freq: Every day | ORAL | 2 refills | Status: DC

## 2019-04-02 ENCOUNTER — Ambulatory Visit (INDEPENDENT_AMBULATORY_CARE_PROVIDER_SITE_OTHER): Payer: Medicare HMO | Admitting: Family Medicine

## 2019-04-02 ENCOUNTER — Encounter: Payer: Self-pay | Admitting: Family Medicine

## 2019-04-02 ENCOUNTER — Other Ambulatory Visit: Payer: Self-pay

## 2019-04-02 VITALS — BP 128/68 | HR 85

## 2019-04-02 DIAGNOSIS — R3911 Hesitancy of micturition: Secondary | ICD-10-CM | POA: Diagnosis not present

## 2019-04-02 DIAGNOSIS — R918 Other nonspecific abnormal finding of lung field: Secondary | ICD-10-CM

## 2019-04-02 DIAGNOSIS — I10 Essential (primary) hypertension: Secondary | ICD-10-CM | POA: Diagnosis not present

## 2019-04-02 DIAGNOSIS — Z122 Encounter for screening for malignant neoplasm of respiratory organs: Secondary | ICD-10-CM

## 2019-04-02 DIAGNOSIS — Z23 Encounter for immunization: Secondary | ICD-10-CM | POA: Diagnosis not present

## 2019-04-02 DIAGNOSIS — N401 Enlarged prostate with lower urinary tract symptoms: Secondary | ICD-10-CM

## 2019-04-02 DIAGNOSIS — E785 Hyperlipidemia, unspecified: Secondary | ICD-10-CM | POA: Diagnosis not present

## 2019-04-02 DIAGNOSIS — Z720 Tobacco use: Secondary | ICD-10-CM | POA: Diagnosis not present

## 2019-04-02 MED ORDER — ATORVASTATIN CALCIUM 40 MG PO TABS: 40.0000 mg | ORAL_TABLET | Freq: Every day | ORAL | 3 refills | Status: DC

## 2019-04-02 MED ORDER — FINASTERIDE 5 MG PO TABS: 5.0000 mg | ORAL_TABLET | Freq: Every day | ORAL | 2 refills | Status: DC

## 2019-04-02 NOTE — Patient Instructions (Addendum)
Thank you so much for coming in to see me today!  I have placed referral for your annual lung cancer screening. Please be sure to get this done.  I will call you with your results if they are abnormal.  As discussed, please be sure to take your Amlodipine, Lisinopril-HCTZ, Atorvastatin, and Finasteride everyday.  Take care, Dr. Tarry Kos

## 2019-04-02 NOTE — Progress Notes (Signed)
Subjective:   Patient ID: Joe Shannon    DOB: 22-Mar-1951, 68 y.o. male   MRN: [id number]  Joe Shannon is a 68 y.o. male with a history of HTN, HLD, tobacco use here for HTN follow up  HTN: Blood pressure today 128/68. Currently takes Amlodipine 10mg  QD and Lisinopril-HCTZ 20-25mg  QD. Denies any chest pain, SOB, vision changes or headaches.  HLD:  Was supposed to be taking Lipitor 40mg  QD but notes he has not been taking this. Denies ever having any side effects such as muscle aches or weakness  BPH: Notes waking up every 2 hours to urinate and dribbles. Once he gets going he has better symptoms. Has taken Finasteride and Tamsulosin but stopped because he got nervous about the side effects when reading about them. He denies ever experiencing any side effects.  Tobacco use: Smokes 1.5 PPD/day x 47 years. No issues with breathing - able to walk up 2 flights of stairs, no chest pain. He has tried vaping and nicotine patches. He notes the vaping helped the most. Is interested in cutting back but is not interested in Chantix given his brother got suicidal symptoms with it. He notes he is not around a bunch of smokers anymore which will be helpful. He is open to vaping and cutting back on his cigarettes.  Review of Systems:  Per HPI.   [employer], medications and smoking status reviewed.  Objective:   BP 128/68   Pulse 85   SpO2 97%  Vitals and nursing note reviewed.  General: well nourished, well developed, in no acute distress with non-toxic appearance, sitting comfortably in exam chair HEENT: normocephalic, atraumatic Neck: supple, normal ROM CV: regular rate and rhythm without murmurs, rubs, or gallops, no lower extremity edema Lungs: clear to auscultation bilaterally with normal work of breathing Abdomen: soft, non-tender, non-distended, normoactive bowel sounds Skin: warm, dry Extremities: warm and well perfused Neuro: Alert and oriented, speech normal  Assessment & Plan:    Essential hypertension Blood pressure remains well controlled with current medications. Asymptomatic and endorses compliance. - continue Norvasc and Prinzide - BMP today to evaluate kidney function - RTC in 6-12 months for check up  BPH (benign prostatic hyperplasia) Patient is symptomatic but nervous about taking medications due to reported side effects. He is open to starting Finasteride 5mg  QD. Can consider adding back on Flomax if not adequate symptom relief with Proscar alone. Educated patient on side effects. Patient understood and open to restarting.  - restart Finasteride 5mg  QD  Tobacco abuse >50% of visit spent on smoking cessation education. Patient has been struggling with quitting. He has had better success when he substituted with vaping. He got down to 1/2 PPD at that time. Discussed smoking cessation options. He is not interested in Chantix. Can consider starting Wellbutrin but patient does not like taking medications so this may not be successful option for patient. Patient agrees to try to cut down on his cigarette use. Will continue to offer cessation advise and can consider other Nicotine replacement options at follow up visit. - Low dose CT scan for lung cancer screen to be scheduled for November 2020  Hyperlipidemia Patient has not been compliant with medication. Educated on importance of taking statin given ASCVD risk of 22% and current smoker. Patient is agreeable to restarting. - Refill for Atorvastatin 40mg  QD provided  Health Maintenance: - flu vaccine provided today - due for low dose CT scan for lung cancer screen in November 2020. Plan to schedule  closer to November  Orders Placed This Encounter  Procedures  . CT CHEST LUNG CA SCREEN LOW DOSE W/O CM    Standing Status:   Future    Standing Expiration Date:   06/01/2020    Order Specific Question:   Reason for Exam (SYMPTOM  OR DIAGNOSIS REQUIRED)    Answer:   LUng cancer screen    Order Specific Question:    Preferred Imaging Location?    Answer:   [facility]    Order Specific Question:   Radiology Contrast Protocol - do NOT remove file path    Answer:   [email or web address].pdf  . Flu Vaccine QUAD 36+ mos IM  . Basic Metabolic Panel   Meds ordered this encounter  Medications  . atorvastatin (LIPITOR) 40 MG tablet    Sig: Take 1 tablet (40 mg total) by mouth daily.    Dispense:  90 tablet    Refill:  3  . finasteride (PROSCAR) 5 MG tablet    Sig: Take 1 tablet (5 mg total) by mouth daily.    Dispense:  90 tablet    Refill:  2    Please consider 90 day supplies to promote better adherence    Mina Marble, DO PGY-2, [facility] 04/04/2019 3:30 PM

## 2019-04-03 ENCOUNTER — Encounter: Payer: Self-pay | Admitting: Family Medicine

## 2019-04-03 LAB — BASIC METABOLIC PANEL
BUN/Creatinine Ratio: 13 (ref 10–24)
BUN: 10 mg/dL (ref 8–27)
CO2: 24 mmol/L (ref 20–29)
Calcium: 9.5 mg/dL (ref 8.6–10.2)
Chloride: 97 mmol/L (ref 96–106)
Creatinine, Ser: 0.76 mg/dL (ref 0.76–1.27)
GFR calc Af Amer: 110 mL/min/{1.73_m2} (ref >59)
GFR calc non Af Amer: 95 mL/min/{1.73_m2} (ref >59)
Glucose: 80 mg/dL (ref 65–99)
Potassium: 4.2 mmol/L (ref 3.5–5.2)
Sodium: 135 mmol/L (ref 134–144)

## 2019-04-04 NOTE — Assessment & Plan Note (Signed)
Blood pressure remains well controlled with current medications. Asymptomatic and endorses compliance. - continue Norvasc and Prinzide - BMP today to evaluate kidney function - RTC in 6-12 months for check up

## 2019-04-04 NOTE — Assessment & Plan Note (Signed)
Patient is symptomatic but nervous about taking medications due to reported side effects. He is open to starting Finasteride 5mg  QD. Can consider adding back on Flomax if not adequate symptom relief with Proscar alone. Educated patient on side effects. Patient understood and open to restarting.  - restart Finasteride 5mg  QD

## 2019-04-04 NOTE — Assessment & Plan Note (Signed)
Patient has not been compliant with medication. Educated on importance of taking statin given ASCVD risk of 22% and current smoker. Patient is agreeable to restarting. - Refill for Atorvastatin 40mg  QD provided

## 2019-04-04 NOTE — Assessment & Plan Note (Addendum)
>  50% of visit spent on smoking cessation education. Patient has been struggling with quitting. He has had better success when he substituted with vaping. He got down to 1/2 PPD at that time. Discussed smoking cessation options. He is not interested in Chantix. Can consider starting Wellbutrin but patient does not like taking medications so this may not be successful option for patient. Patient agrees to try to cut down on his cigarette use. Will continue to offer cessation advise and can consider other Nicotine replacement options at follow up visit. - Low dose CT scan for lung cancer screen to be scheduled for November 2020

## 2019-04-16 ENCOUNTER — Telehealth: Payer: Self-pay

## 2019-04-16 NOTE — Telephone Encounter (Signed)
Attempted to call patient with CT appointment on 04/23/2019 at 1630 with show time of 1615.  No answer and no ability to leave voice mail.  Will try again later.  Joe Shannon, [name]

## 2019-04-17 ENCOUNTER — Telehealth: Payer: Self-pay

## 2019-04-17 NOTE — Telephone Encounter (Signed)
Called patient to inform him of CT scan on 04/23/2019. [place] at [phone] with an arrival of 1615.  If patient calls back please give him his appointment date and time.  Thanks  .Ozella Almond, CMA

## 2019-04-17 NOTE — Telephone Encounter (Signed)
Called and spoke to patient and gave him his appointment for a low dose CT at [facility] on 04/23/2019 at 1630.  Arrival 1615.  Patient verbally acknowledges understanding and was appreciative.  Joe Shannon, [place]

## 2019-04-23 ENCOUNTER — Other Ambulatory Visit: Payer: Self-pay

## 2019-04-23 ENCOUNTER — Ambulatory Visit (HOSPITAL_COMMUNITY)
Admission: RE | Admit: 2019-04-23 | Discharge: 2019-04-23 | Disposition: A | Discharge status: Home / Self Care | Payer: Medicare HMO | Source: Ambulatory Visit | Attending: Family Medicine | Admitting: Family Medicine

## 2019-04-23 DIAGNOSIS — R69 Illness, unspecified: Secondary | ICD-10-CM | POA: Diagnosis not present

## 2019-04-23 DIAGNOSIS — I700 Atherosclerosis of aorta: Secondary | ICD-10-CM | POA: Insufficient documentation

## 2019-04-23 DIAGNOSIS — I2510 Atherosclerotic heart disease of native coronary artery without angina pectoris: Secondary | ICD-10-CM | POA: Diagnosis not present

## 2019-04-23 DIAGNOSIS — J439 Emphysema, unspecified: Secondary | ICD-10-CM | POA: Insufficient documentation

## 2019-04-23 DIAGNOSIS — Z122 Encounter for screening for malignant neoplasm of respiratory organs: Secondary | ICD-10-CM

## 2019-04-23 DIAGNOSIS — F17210 Nicotine dependence, cigarettes, uncomplicated: Secondary | ICD-10-CM | POA: Insufficient documentation

## 2019-04-26 ENCOUNTER — Encounter: Payer: Self-pay | Admitting: Family Medicine

## 2019-09-03 ENCOUNTER — Other Ambulatory Visit: Payer: Self-pay | Admitting: Family Medicine

## 2019-09-03 DIAGNOSIS — I10 Essential (primary) hypertension: Secondary | ICD-10-CM

## 2019-11-21 ENCOUNTER — Other Ambulatory Visit: Payer: Self-pay | Admitting: Family Medicine

## 2019-11-21 DIAGNOSIS — I10 Essential (primary) hypertension: Secondary | ICD-10-CM

## 2020-01-26 IMAGING — CT CT CHEST LUNG CANCER SCREENING LOW DOSE W/O CM
1 of 3 series · 10 of 30 positions shown, 13 images · non-contrast
Comparison: 01/27/2017 screening chest CT.

CLINICAL DATA: 67-year-old asymptomatic male current smoker with 42
pack-year smoking history.

EXAM:
CT CHEST WITHOUT CONTRAST LOW-DOSE FOR LUNG CANCER SCREENING
TECHNIQUE: Multidetector CT imaging of the chest was performed following the
standard protocol without IV contrast.

[ct lung segmentation data · axial · 0.66mm/px · z∈[-366,-366]mm · 10 of 345 frames shown]
[frame 1/345  mediastinal]
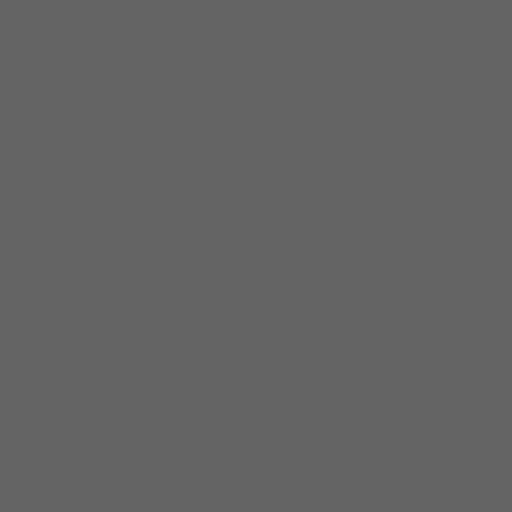
[frame 1/345  lung]
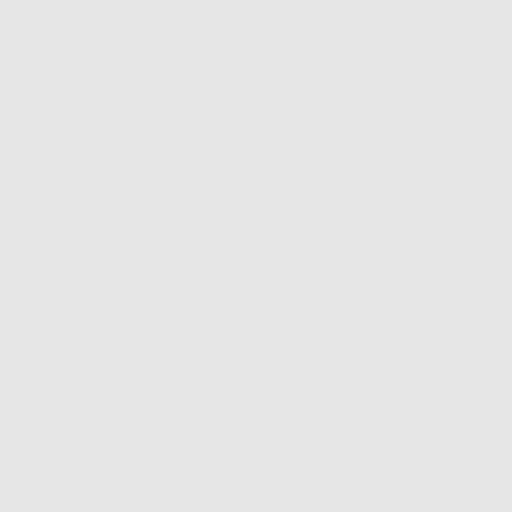
[frame 39/345  lung]
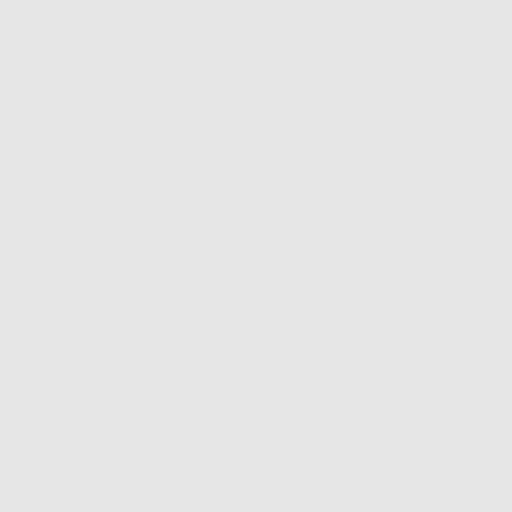
[frame 77/345  lung]
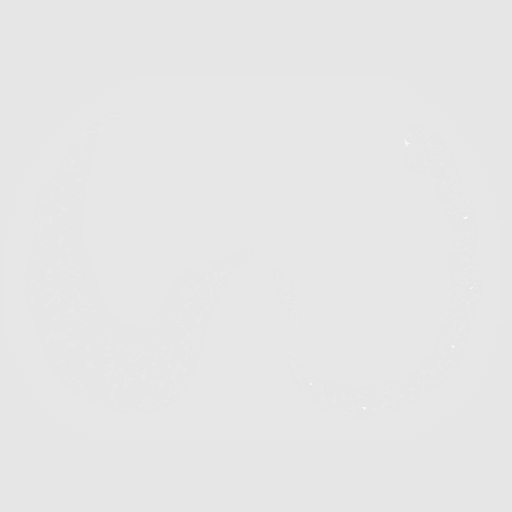
[frame 115/345  lung]
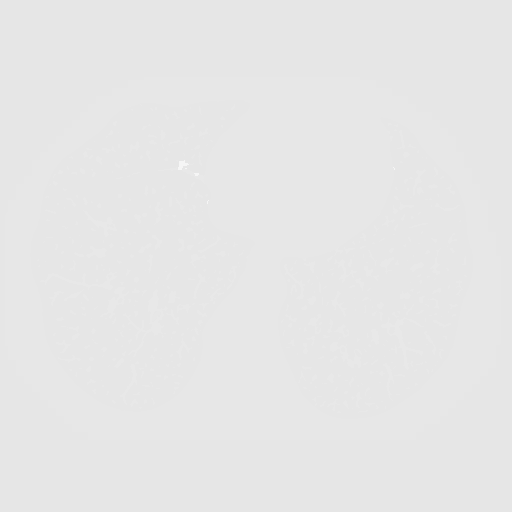
[frame 153/345  mediastinal]
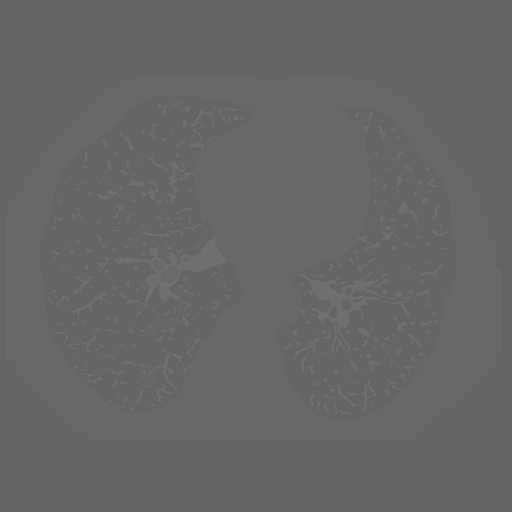
[frame 153/345  lung]
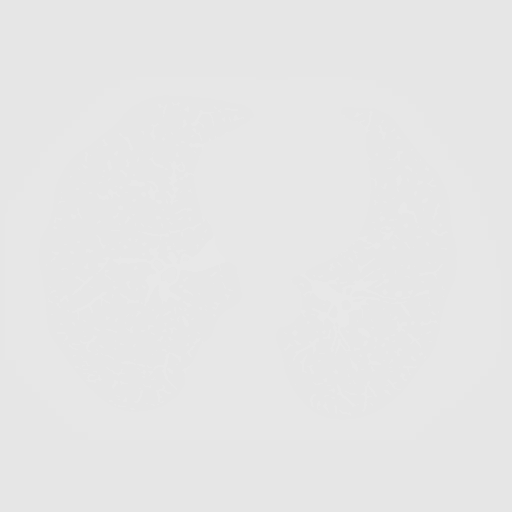
[frame 192/345  lung]
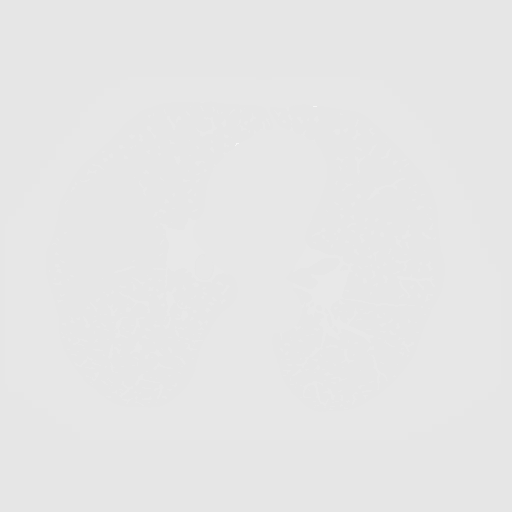
[frame 230/345  lung]
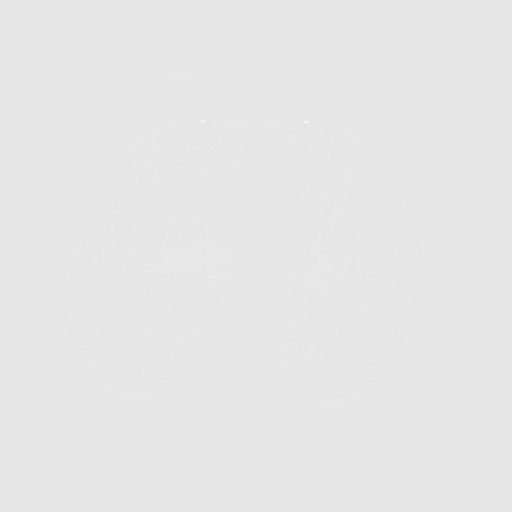
[frame 268/345  lung]
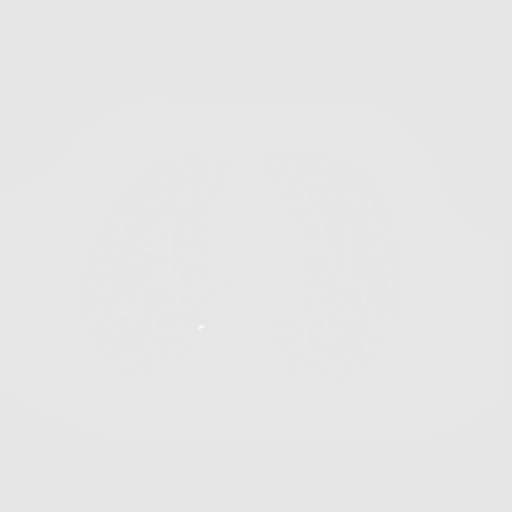
[frame 306/345  mediastinal]
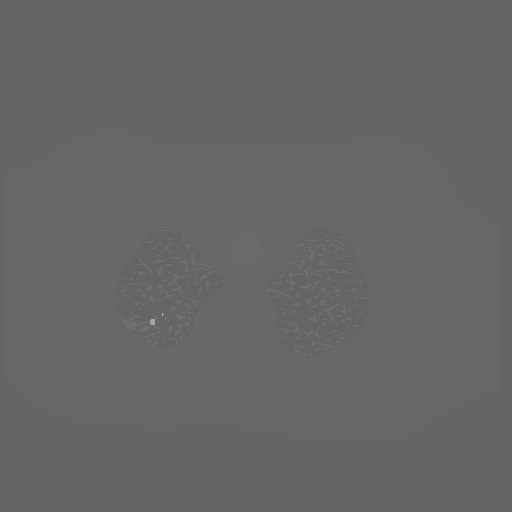
[frame 306/345  lung]
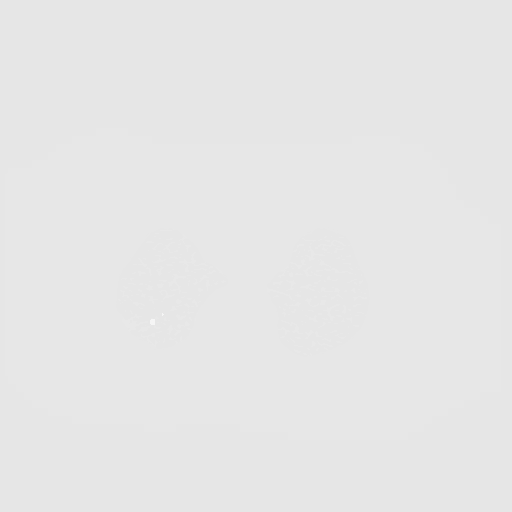
[frame 345/345  lung]
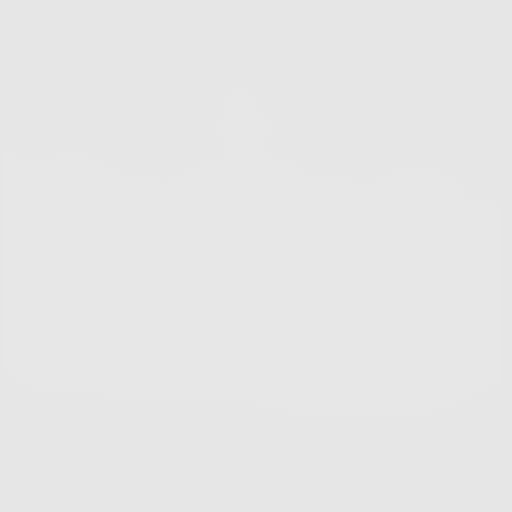

[10 of 30 positions shown; findings below may reference images not displayed]

FINDINGS: Cardiovascular: Normal heart size. No significant pericardial
effusion/thickening. Three-vessel coronary atherosclerosis.
Atherosclerotic nonaneurysmal thoracic aorta. Normal caliber
pulmonary arteries.

Mediastinum/Nodes: No discrete thyroid nodules. Unremarkable
esophagus. No pathologically enlarged axillary, mediastinal or hilar
lymph nodes, noting limited sensitivity for the detection of hilar
adenopathy on this noncontrast study.

Lungs/Pleura: No pneumothorax. No pleural effusion. Mild
centrilobular emphysema. No acute consolidative airspace disease or
lung masses. No significant growth of previously visualized small
left upper lobe pulmonary nodule. No new significant pulmonary
nodules.

Upper abdomen: Scattered small simple liver cysts, largest 2.4 cm in
the segment 3 left liver lobe.

Musculoskeletal: No aggressive appearing focal osseous lesions.
Moderate thoracic spondylosis. Chronic mild T11 and moderate L1
vertebral compression fractures.
IMPRESSION: 1. Lung-RADS 2, benign appearance or behavior. Continue annual
screening with low-dose chest CT without contrast in 12 months.
2. Three-vessel coronary atherosclerosis.

Aortic Atherosclerosis (J3NO0-PRT.T) and Emphysema (J3NO0-2B8.C).

## 2020-03-11 ENCOUNTER — Other Ambulatory Visit: Payer: Self-pay | Admitting: Family Medicine

## 2020-03-11 DIAGNOSIS — I10 Essential (primary) hypertension: Secondary | ICD-10-CM

## 2020-04-09 ENCOUNTER — Other Ambulatory Visit: Payer: Self-pay

## 2020-04-09 ENCOUNTER — Ambulatory Visit (INDEPENDENT_AMBULATORY_CARE_PROVIDER_SITE_OTHER): Payer: Medicare HMO | Admitting: Family Medicine

## 2020-04-09 VITALS — BP 118/80 | HR 86 | Ht 69.0 in | Wt 173.6 lb

## 2020-04-09 DIAGNOSIS — Z23 Encounter for immunization: Secondary | ICD-10-CM | POA: Diagnosis not present

## 2020-04-09 DIAGNOSIS — D649 Anemia, unspecified: Secondary | ICD-10-CM

## 2020-04-09 DIAGNOSIS — I10 Essential (primary) hypertension: Secondary | ICD-10-CM

## 2020-04-09 DIAGNOSIS — Z125 Encounter for screening for malignant neoplasm of prostate: Secondary | ICD-10-CM | POA: Diagnosis not present

## 2020-04-09 DIAGNOSIS — Z720 Tobacco use: Secondary | ICD-10-CM | POA: Diagnosis not present

## 2020-04-09 DIAGNOSIS — E785 Hyperlipidemia, unspecified: Secondary | ICD-10-CM

## 2020-04-09 MED ORDER — ZOSTER VAC RECOMB ADJUVANTED 50 MCG/0.5ML IM SUSR: INTRAMUSCULAR | 1 refills | Status: AC

## 2020-04-09 NOTE — Assessment & Plan Note (Signed)
BP Readings from Last 3 Encounters:  04/09/20 118/80  04/02/19 128/68  10/18/18 (!) 86/45   At good control.  Continue current medications Check labs

## 2020-04-09 NOTE — Progress Notes (Signed)
    SUBJECTIVE:   CHIEF COMPLAINT / HPI:   HYPERTENSION Home BP Monitoring readings: not checking regularly  Chest pain- occsl episodes of chest pain when just sitting, lasts a short period of time. Can't recall clearly when last episode was.   Does not occur with exertion.       Medication Adherence  Brings in his meds. Lightheadedness-  no  Edema- no  TOBACCO Smokes 1.5 ppd.  Sort of would like to quit.  Has tried different patches in the past.  Brother took Chantix and became almost suicidal  HYPERLIPIDEMIA Medication Adherence- daily ;ipitor Right upper quadrant pain- no  Muscle aches- no  PERTINENT  PMH / PSH: Drinks more than 5 drinks rarely in the last year.  Does not feel is a problem.    Does not exercise but is very active working on cars   OBJECTIVE:   BP 118/80   Pulse 86   Ht 5\' 9"  (1.753 m)   Wt 173 lb 9.6 oz (78.7 kg)   SpO2 98%   BMI 25.64 kg/m   Mobility:able to get up and down from exam table without assistance or distress Heart - Regular rate and rhythm.  No murmurs, gallops or rubs.    Lungs:  Normal respiratory effort, chest expands symmetrically. Lungs are clear to auscultation, with a few basilar crackles Abdomen: soft and non-tender without masses, organomegaly or hernias noted.  No guarding or rebound Extremities:  No cyanosis, edema, or deformity noted with good range of motion of all major joints.     ASSESSMENT/PLAN:   Essential hypertension BP Readings from Last 3 Encounters:  04/09/20 118/80  04/02/19 128/68  10/18/18 (!) 86/45   At good control.  Continue current medications Check labs   Hyperlipidemia Stable.  Check labs   Tobacco abuse Mostly precontemplative.  See after visit summary. Will screen for lung cancer  Anemia Hgb normal in 2019    HM Discussed smoking, alcohol use,  See after visit summary   Lind Covert, MD [place]

## 2020-04-09 NOTE — Assessment & Plan Note (Signed)
Hgb normal in 2019

## 2020-04-09 NOTE — Patient Instructions (Addendum)
Good to see you today!  Thanks for coming in.  The number 1 thing for your health would be to stop smoking.  Be glad to talk with you about this - just make an appointment  I will call you if your tests are not good.  Otherwise, I will send you a message on MyChart (if it is active) or a letter in the mail..  If you do not hear from me with in 2 weeks please call our office.    If you develop any chest pain or significant shortness of breath with exertion let us know immediately  I sent your shingles shot order to your pharmacy  They will contact you about your lung cancer CT screen  Come back in 6 months for a blood pressure check

## 2020-04-09 NOTE — Assessment & Plan Note (Signed)
Stable Check labs 

## 2020-04-09 NOTE — Assessment & Plan Note (Addendum)
Mostly precontemplative.  See after visit summary. Will screen for lung cancer

## 2020-04-10 ENCOUNTER — Encounter: Payer: Self-pay | Admitting: Family Medicine

## 2020-04-10 LAB — CMP14+EGFR
ALT: 25 IU/L (ref 0–44)
AST: 27 IU/L (ref 0–40)
Albumin/Globulin Ratio: 2 (ref 1.2–2.2)
Albumin: 5 g/dL — ABNORMAL HIGH (ref 3.8–4.8)
Alkaline Phosphatase: 62 IU/L (ref 44–121)
BUN/Creatinine Ratio: 13 (ref 10–24)
BUN: 9 mg/dL (ref 8–27)
Bilirubin Total: 0.4 mg/dL (ref 0.0–1.2)
CO2: 25 mmol/L (ref 20–29)
Calcium: 9.8 mg/dL (ref 8.6–10.2)
Chloride: 92 mmol/L — ABNORMAL LOW (ref 96–106)
Creatinine, Ser: 0.71 mg/dL — ABNORMAL LOW (ref 0.76–1.27)
GFR calc Af Amer: 112 mL/min/{1.73_m2} (ref >59)
GFR calc non Af Amer: 97 mL/min/{1.73_m2} (ref >59)
Globulin, Total: 2.5 g/dL (ref 1.5–4.5)
Glucose: 96 mg/dL (ref 65–99)
Potassium: 4.4 mmol/L (ref 3.5–5.2)
Sodium: 130 mmol/L — ABNORMAL LOW (ref 134–144)
Total Protein: 7.5 g/dL (ref 6.0–8.5)

## 2020-04-10 LAB — LIPID PANEL
Chol/HDL Ratio: 2.7 ratio (ref 0.0–5.0)
Cholesterol, Total: 170 mg/dL (ref 100–199)
HDL: 63 mg/dL (ref >39)
LDL Chol Calc (NIH): 93 mg/dL (ref 0–99)
Triglycerides: 77 mg/dL (ref 0–149)
VLDL Cholesterol Cal: 14 mg/dL (ref 5–40)

## 2020-04-10 LAB — PSA: Prostate Specific Ag, Serum: 1.7 ng/mL (ref 0.0–4.0)

## 2020-04-11 NOTE — Addendum Note (Signed)
Addended by: Talbert Cage L on: 04/11/2020 09:46 AM   Modules accepted: Orders

## 2020-04-14 ENCOUNTER — Telehealth: Payer: Self-pay | Admitting: *Deleted

## 2020-04-14 NOTE — Telephone Encounter (Signed)
-----   Message from Lind Covert, MD sent at 04/11/2020  9:46 AM EDT ----- Regarding: I put in a new lung cancer screen CT This should be able to be scheduled  Thanks

## 2020-04-14 NOTE — Telephone Encounter (Signed)
LMOVM informing pt of C appt. Gianny Killman Kennon Holter, CMA

## 2020-04-18 DIAGNOSIS — I10 Essential (primary) hypertension: Secondary | ICD-10-CM | POA: Diagnosis not present

## 2020-04-18 DIAGNOSIS — Z823 Family history of stroke: Secondary | ICD-10-CM | POA: Diagnosis not present

## 2020-04-18 DIAGNOSIS — Z833 Family history of diabetes mellitus: Secondary | ICD-10-CM | POA: Diagnosis not present

## 2020-04-18 DIAGNOSIS — J439 Emphysema, unspecified: Secondary | ICD-10-CM | POA: Diagnosis not present

## 2020-04-18 DIAGNOSIS — I2510 Atherosclerotic heart disease of native coronary artery without angina pectoris: Secondary | ICD-10-CM | POA: Diagnosis not present

## 2020-04-18 DIAGNOSIS — Z7982 Long term (current) use of aspirin: Secondary | ICD-10-CM | POA: Diagnosis not present

## 2020-04-18 DIAGNOSIS — Z809 Family history of malignant neoplasm, unspecified: Secondary | ICD-10-CM | POA: Diagnosis not present

## 2020-04-18 DIAGNOSIS — R69 Illness, unspecified: Secondary | ICD-10-CM | POA: Diagnosis not present

## 2020-04-18 DIAGNOSIS — E785 Hyperlipidemia, unspecified: Secondary | ICD-10-CM | POA: Diagnosis not present

## 2020-04-18 DIAGNOSIS — Z8249 Family history of ischemic heart disease and other diseases of the circulatory system: Secondary | ICD-10-CM | POA: Diagnosis not present

## 2020-04-22 ENCOUNTER — Other Ambulatory Visit: Payer: Self-pay

## 2020-04-22 ENCOUNTER — Ambulatory Visit (HOSPITAL_BASED_OUTPATIENT_CLINIC_OR_DEPARTMENT_OTHER)
Admission: RE | Admit: 2020-04-22 | Discharge: 2020-04-22 | Disposition: A | Discharge status: Home / Self Care | Payer: Medicare HMO | Source: Ambulatory Visit | Attending: Family Medicine | Admitting: Family Medicine

## 2020-04-22 DIAGNOSIS — J432 Centrilobular emphysema: Secondary | ICD-10-CM | POA: Diagnosis not present

## 2020-04-22 DIAGNOSIS — R69 Illness, unspecified: Secondary | ICD-10-CM | POA: Diagnosis not present

## 2020-04-22 DIAGNOSIS — F17200 Nicotine dependence, unspecified, uncomplicated: Secondary | ICD-10-CM | POA: Diagnosis not present

## 2020-04-22 DIAGNOSIS — I700 Atherosclerosis of aorta: Secondary | ICD-10-CM | POA: Diagnosis not present

## 2020-04-22 DIAGNOSIS — I2510 Atherosclerotic heart disease of native coronary artery without angina pectoris: Secondary | ICD-10-CM | POA: Insufficient documentation

## 2020-04-22 DIAGNOSIS — Z122 Encounter for screening for malignant neoplasm of respiratory organs: Secondary | ICD-10-CM | POA: Diagnosis not present

## 2020-04-22 DIAGNOSIS — Z720 Tobacco use: Secondary | ICD-10-CM

## 2020-04-25 ENCOUNTER — Telehealth: Payer: Self-pay | Admitting: Family Medicine

## 2020-04-25 ENCOUNTER — Encounter: Payer: Self-pay | Admitting: Family Medicine

## 2020-04-25 DIAGNOSIS — J439 Emphysema, unspecified: Secondary | ICD-10-CM | POA: Insufficient documentation

## 2020-04-25 NOTE — Telephone Encounter (Signed)
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

## 2020-04-27 ENCOUNTER — Encounter: Payer: Self-pay | Admitting: Family Medicine

## 2020-04-27 DIAGNOSIS — I2510 Atherosclerotic heart disease of native coronary artery without angina pectoris: Secondary | ICD-10-CM | POA: Insufficient documentation

## 2020-04-27 DIAGNOSIS — I700 Atherosclerosis of aorta: Secondary | ICD-10-CM | POA: Insufficient documentation

## 2020-08-09 ENCOUNTER — Other Ambulatory Visit: Payer: Self-pay | Admitting: Family Medicine

## 2020-08-09 DIAGNOSIS — I10 Essential (primary) hypertension: Secondary | ICD-10-CM

## 2020-08-09 DIAGNOSIS — E785 Hyperlipidemia, unspecified: Secondary | ICD-10-CM

## 2021-01-22 DIAGNOSIS — Z8249 Family history of ischemic heart disease and other diseases of the circulatory system: Secondary | ICD-10-CM | POA: Diagnosis not present

## 2021-01-22 DIAGNOSIS — Z823 Family history of stroke: Secondary | ICD-10-CM | POA: Diagnosis not present

## 2021-01-22 DIAGNOSIS — J439 Emphysema, unspecified: Secondary | ICD-10-CM | POA: Diagnosis not present

## 2021-01-22 DIAGNOSIS — R69 Illness, unspecified: Secondary | ICD-10-CM | POA: Diagnosis not present

## 2021-01-22 DIAGNOSIS — Z809 Family history of malignant neoplasm, unspecified: Secondary | ICD-10-CM | POA: Diagnosis not present

## 2021-01-22 DIAGNOSIS — Z7982 Long term (current) use of aspirin: Secondary | ICD-10-CM | POA: Diagnosis not present

## 2021-01-22 DIAGNOSIS — Z833 Family history of diabetes mellitus: Secondary | ICD-10-CM | POA: Diagnosis not present

## 2021-01-22 DIAGNOSIS — E785 Hyperlipidemia, unspecified: Secondary | ICD-10-CM | POA: Diagnosis not present

## 2021-01-22 DIAGNOSIS — N529 Male erectile dysfunction, unspecified: Secondary | ICD-10-CM | POA: Diagnosis not present

## 2021-01-22 DIAGNOSIS — I10 Essential (primary) hypertension: Secondary | ICD-10-CM | POA: Diagnosis not present

## 2021-04-02 NOTE — Progress Notes (Signed)
    SUBJECTIVE:   CHIEF COMPLAINT / HPI:    Chest pain When over exerting self, causing him to have to sit down. Located on left side of chest/epigastric area, happens 2-3 times a week. Denies any shortness of breath. Heart beats very hard when he is having these episodes. Takes about 10-15 min to resolve when resting. Heart disease run's in family, mother died during bypass. Brother takes nitro and had PCI balloon angioplasty. Wrist BP reader sometimes has BP in 190's-200's.   Left Leg/buttock pain Pain hurts in anterior and posterior portion of leg, happens everyday, gets worse with activity, not so bad in the morning. Takes tylenol to improve the pain. Hurts when he uses the leg, causes him to limp. Hurts constantly and is worse with activity. Had sciatic pain on right leg, and the pain in the left leg is not the same.   Hearing issues. Been getting worse for the last 3 years. Left ear can barely hear out of it, and the right ear hears half as well.   Tobacco:  1 PPD for 50 years, interested in cessation  PERTINENT  PMH / PSH: HTN, HLD, tobacco use, BPH, COPD (emphysema)  OBJECTIVE:   BP 140/78   Pulse 85   Wt 176 lb 6 oz (80 kg)   SpO2 98%   BMI 26.05 kg/m   Physical Exam Constitutional:      Appearance: Normal appearance. He is normal weight.  HENT:     Right Ear: Tympanic membrane, ear canal and external ear normal. There is impacted cerumen.     Left Ear: Tympanic membrane, ear canal and external ear normal. There is impacted cerumen.     Mouth/Throat:     Mouth: Mucous membranes are moist.  Eyes:     Extraocular Movements: Extraocular movements intact.  Cardiovascular:     Rate and Rhythm: Normal rate and regular rhythm.     Pulses: Normal pulses.     Heart sounds: Murmur heard.    No friction rub. No gallop.  Pulmonary:     Effort: Pulmonary effort is normal. No respiratory distress.     Breath sounds: Normal breath sounds. No stridor. No wheezing.  Abdominal:      General: Bowel sounds are normal. There is no distension.     Tenderness: There is no abdominal tenderness.  Musculoskeletal:        General: Tenderness present. No swelling, deformity or signs of injury. Normal range of motion.     Right lower leg: No edema.     Left lower leg: No edema.  Neurological:     Mental Status: He is alert.     ASSESSMENT/PLAN:   Chest pain Patient presents with chest pain going on for last few months. Happens 2-3x a weeks causing him to sit down for relief.  -EKG -Echocardiography -Cardiology referral -CXR -Continue ASA 81 -Nitroglycerine as needed for chest pain  Hearing loss Patient with hearing loss in both ears, going back 2-3 years. Worse in left ear than right -Will place outpatient referral with audiology  Leg pain Patient presenting with leg pain that happens constantly and is worse with activity. Instructed patient to make follow up appointment to discuss leg issues -Patient to make follow up  Tobacco abuse Patient interested in tobacco cessation. -Will refer to Dr. Ernesta Amble, [place]

## 2021-04-03 ENCOUNTER — Encounter: Payer: Self-pay | Admitting: Student

## 2021-04-03 ENCOUNTER — Ambulatory Visit (INDEPENDENT_AMBULATORY_CARE_PROVIDER_SITE_OTHER): Payer: Medicare HMO | Admitting: Student

## 2021-04-03 ENCOUNTER — Other Ambulatory Visit: Payer: Self-pay

## 2021-04-03 ENCOUNTER — Ambulatory Visit (HOSPITAL_COMMUNITY)
Admission: RE | Admit: 2021-04-03 | Discharge: 2021-04-03 | Disposition: A | Discharge status: Home / Self Care | Payer: Medicare HMO | Source: Ambulatory Visit | Attending: Family Medicine | Admitting: Family Medicine

## 2021-04-03 VITALS — BP 140/78 | HR 85 | Wt 176.4 lb

## 2021-04-03 DIAGNOSIS — R079 Chest pain, unspecified: Secondary | ICD-10-CM

## 2021-04-03 DIAGNOSIS — Z23 Encounter for immunization: Secondary | ICD-10-CM | POA: Diagnosis not present

## 2021-04-03 DIAGNOSIS — Z720 Tobacco use: Secondary | ICD-10-CM | POA: Diagnosis not present

## 2021-04-03 DIAGNOSIS — H9190 Unspecified hearing loss, unspecified ear: Secondary | ICD-10-CM | POA: Insufficient documentation

## 2021-04-03 DIAGNOSIS — H9193 Unspecified hearing loss, bilateral: Secondary | ICD-10-CM | POA: Diagnosis not present

## 2021-04-03 DIAGNOSIS — Z0000 Encounter for general adult medical examination without abnormal findings: Secondary | ICD-10-CM

## 2021-04-03 DIAGNOSIS — M79605 Pain in left leg: Secondary | ICD-10-CM | POA: Diagnosis not present

## 2021-04-03 DIAGNOSIS — M79606 Pain in leg, unspecified: Secondary | ICD-10-CM | POA: Insufficient documentation

## 2021-04-03 MED ORDER — NITROGLYCERIN 0.4 MG SL SUBL: 0.4000 mg | SUBLINGUAL_TABLET | SUBLINGUAL | 1 refills | Status: AC | PRN

## 2021-04-03 NOTE — Assessment & Plan Note (Addendum)
Patient with hearing loss in both ears, going back 2-3 years. Worse in left ear than right -Will place outpatient referral with audiology

## 2021-04-03 NOTE — Assessment & Plan Note (Signed)
Patient presenting with leg pain that happens constantly and is worse with activity. Instructed patient to make follow up appointment to discuss leg issues -Patient to make follow up

## 2021-04-03 NOTE — Patient Instructions (Addendum)
If you develop chest pain please go to the Emergency Department Immediately    It was great to see you! Thank you for allowing me to participate in your care!    Our plans for today:  Take an aspirin every day    Take nitroglycerin for chest pain, and then go to Emergency Room  An x-ray was ordered for you---you do not need an appointment to have this completed.  I recommend going to [facility]   If the results are normal,I will send you a letter  I will call you with results if anything is abnormal   We are checking some labs today, I will call you if they are abnormal will send you a MyChart message or a letter if they are normal.  If you do not hear about your labs in the next 2 weeks please let us know.  Take care and seek immediate care sooner if you develop any concerns.   Dr. Holley Bouche, MD [place]

## 2021-04-03 NOTE — Assessment & Plan Note (Signed)
Patient presents with chest pain going on for last few months. Happens 2-3x a weeks causing him to sit down for relief.  -EKG -Echocardiography -Cardiology referral -CXR -Continue ASA 81 -Nitroglycerine as needed for chest pain

## 2021-04-03 NOTE — Assessment & Plan Note (Signed)
Patient interested in tobacco cessation. -Will refer to Dr. Valentina Lucks

## 2021-04-04 LAB — BASIC METABOLIC PANEL
BUN/Creatinine Ratio: 13 (ref 10–24)
BUN: 10 mg/dL (ref 8–27)
CO2: 25 mmol/L (ref 20–29)
Calcium: 9.6 mg/dL (ref 8.6–10.2)
Chloride: 89 mmol/L — ABNORMAL LOW (ref 96–106)
Creatinine, Ser: 0.75 mg/dL — ABNORMAL LOW (ref 0.76–1.27)
Glucose: 95 mg/dL (ref 70–99)
Potassium: 4.3 mmol/L (ref 3.5–5.2)
Sodium: 131 mmol/L — ABNORMAL LOW (ref 134–144)
eGFR: 98 mL/min/{1.73_m2} (ref >59)

## 2021-04-04 LAB — LIPID PANEL
Chol/HDL Ratio: 3 ratio (ref 0.0–5.0)
Cholesterol, Total: 191 mg/dL (ref 100–199)
HDL: 63 mg/dL (ref >39)
LDL Chol Calc (NIH): 110 mg/dL — ABNORMAL HIGH (ref 0–99)
Triglycerides: 103 mg/dL (ref 0–149)
VLDL Cholesterol Cal: 18 mg/dL (ref 5–40)

## 2021-04-04 LAB — CBC
Hematocrit: 40.4 % (ref 37.5–51.0)
Hemoglobin: 13.9 g/dL (ref 13.0–17.7)
MCH: 30.8 pg (ref 26.6–33.0)
MCHC: 34.4 g/dL (ref 31.5–35.7)
MCV: 90 fL (ref 79–97)
Platelets: 289 10*3/uL (ref 150–450)
RBC: 4.51 x10E6/uL (ref 4.14–5.80)
RDW: 12.5 % (ref 11.6–15.4)
WBC: 6.2 10*3/uL (ref 3.4–10.8)

## 2021-04-14 ENCOUNTER — Other Ambulatory Visit: Payer: Self-pay

## 2021-04-14 ENCOUNTER — Ambulatory Visit: Payer: Medicare HMO | Attending: Family Medicine | Admitting: Audiologist

## 2021-04-14 DIAGNOSIS — H90A32 Mixed conductive and sensorineural hearing loss, unilateral, left ear with restricted hearing on the contralateral side: Secondary | ICD-10-CM | POA: Diagnosis not present

## 2021-04-14 DIAGNOSIS — H90A21 Sensorineural hearing loss, unilateral, right ear, with restricted hearing on the contralateral side: Secondary | ICD-10-CM | POA: Insufficient documentation
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
## 2021-04-15 ENCOUNTER — Telehealth: Payer: Self-pay | Admitting: Student

## 2021-04-15 ENCOUNTER — Ambulatory Visit (HOSPITAL_COMMUNITY): Payer: Medicare HMO | Attending: Family Medicine

## 2021-04-15 DIAGNOSIS — R079 Chest pain, unspecified: Secondary | ICD-10-CM | POA: Diagnosis present

## 2021-04-15 LAB — ECHOCARDIOGRAM COMPLETE
Area-P 1/2: 1.78 cm2
S' Lateral: 3.3 cm

## 2021-04-15 NOTE — Telephone Encounter (Signed)
Attempted to call patient with results from recent echocardiogram.  Left voice mail with clinic number. Will attempt to call pt again tomorrow.

## 2021-04-16 ENCOUNTER — Telehealth: Payer: Self-pay | Admitting: Student

## 2021-04-16 NOTE — Telephone Encounter (Signed)
Spoke with pt on the phone about his recent echocardiogram results. All questions answered.

## 2021-04-23 ENCOUNTER — Encounter: Payer: Self-pay | Admitting: Student

## 2021-04-23 ENCOUNTER — Telehealth: Payer: Self-pay | Admitting: Pharmacist

## 2021-04-23 ENCOUNTER — Ambulatory Visit (INDEPENDENT_AMBULATORY_CARE_PROVIDER_SITE_OTHER): Payer: Medicare HMO | Admitting: Student

## 2021-04-23 ENCOUNTER — Other Ambulatory Visit: Payer: Self-pay

## 2021-04-23 VITALS — BP 138/82 | HR 83 | Ht 69.0 in | Wt 176.4 lb

## 2021-04-23 DIAGNOSIS — R079 Chest pain, unspecified: Secondary | ICD-10-CM

## 2021-04-23 DIAGNOSIS — Z720 Tobacco use: Secondary | ICD-10-CM | POA: Diagnosis not present

## 2021-04-23 DIAGNOSIS — H9193 Unspecified hearing loss, bilateral: Secondary | ICD-10-CM | POA: Diagnosis not present

## 2021-04-23 DIAGNOSIS — M79605 Pain in left leg: Secondary | ICD-10-CM | POA: Diagnosis not present

## 2021-04-23 NOTE — Progress Notes (Signed)
SUBJECTIVE:   CHIEF COMPLAINT / HPI:   Cardiac: Echo non-concerning and results were explained. Chest pain: about the same, only had one episode since the last visit. Was helping brother lift and move things, rested and felt fine. No other symptoms dizziness, nausea sweating, no other pain, did not need to use nitro. Is aware of appointment with cardiology of Oct 26th.  Hearing: Received information from doctor about hearing loss. Is aware that he needs a ENT referral. send a referral to [facility] or another Otolaryngology practice. Please include this report and audiogram under media. For left middle ear.    Leg Pain: Sitting down for longtime cause it to be painful. When sitting to standing it hurts, not as bad when walking. It hurts when he's standing for long times. Pain is described as sharp. He's used pain patches that didn't help. Runs from top of his leg/hip area, down his anterior leg. Hurts more during the day, more bad during the day. Sharp shooting pain, starting around hip and buttocks and shooting down the leg. Has been happening for last 3-4 months. Was more tolerable last visit. Sometimes he has to lift his leg and move it with his hand to get out of the car. Taking tylenol, but no relife. Pain still 8/10. History of Sciatic nerve issues on right leg, this pain on left leg. This pain not like that pain, and seems to present the opposite way, being worse when he starts using it, and ok with activity.   Tobacco cessation: Interested in tobacco cessation, will connect him with Dr. Valentina Lucks  PERTINENT  PMH / [place]: Chest pain, HTN, HLD, Left leg pain, tobacco abuse, Hearing loss  OBJECTIVE:   BP 138/82   Pulse 83   Ht 5\' 9"  (1.753 m)   Wt 176 lb 6.4 oz (80 kg)   SpO2 98%   BMI 26.05 kg/m   Physical Exam Constitutional:      Appearance: Normal appearance. He is well-developed.  Cardiovascular:     Rate and Rhythm: Normal rate and regular rhythm.     Pulses: Normal  pulses.     Heart sounds: Murmur heard.  Pulmonary:     Effort: Pulmonary effort is normal. No respiratory distress.     Breath sounds: Normal breath sounds. No wheezing or rhonchi.  Abdominal:     General: Abdomen is flat. Bowel sounds are normal.     Palpations: Abdomen is soft.     Tenderness: There is no abdominal tenderness.  Musculoskeletal:     Right hip: No tenderness. Normal range of motion. Normal strength.     Left hip: Tenderness present. Normal range of motion. Normal strength.     Left upper leg: Tenderness present.     Comments: Active range of motion normal. Pain when getting up, but not worse when lifting leg.  Neurological:     Mental Status: He is alert.  Psychiatric:        Mood and Affect: Mood normal.        Behavior: Behavior normal.     ASSESSMENT/PLAN:   Chest pain Has had only one episode of chest pain since last visit, that was brought on by lifting and relieved with rest. No need for nitro use. Taking all BP meds. Has appointment with cardiology for 10/26.  -Discussed Cardiology appointment goals  Chest pain  Heart Murmur  Echo results  Leg pain Leg pain persistent for 3-4 months, improves with activity, but worse with starting  activity. Patient has tried tylenol with no relief. Is effecting the way he moves and get's up from seated position/starts gait. Unsure about dose of Tylenol patient is taking, will encourage daily scheduling.  - Hip X-ray - Schedule Tylenol, max 3,000 mg in a day    Tobacco abuse Intrested in quitting -Will schedule appointment with Dr. Valentina Lucks  Hearing loss Found to have hearing loss.  -ENT referral placed     Holley Bouche, [place]

## 2021-04-23 NOTE — Assessment & Plan Note (Addendum)
Found to have hearing loss.  -ENT referral placed

## 2021-04-23 NOTE — Assessment & Plan Note (Addendum)
Leg pain persistent for 3-4 months, improves with activity, but worse with starting activity. Patient has tried tylenol with no relief. Is effecting the way he moves and get's up from seated position/starts gait. Unsure about dose of Tylenol patient is taking, will encourage daily scheduling.  - Hip X-ray - Schedule Tylenol, max 3,000 mg in a day

## 2021-04-23 NOTE — Patient Instructions (Addendum)
It was great to see you! Thank you for allowing me to participate in your care!  Our plans for today:  - X-ray of left hip - Recommend over the counter tylenol scheduled, for hip pain. (No more than 3,000 mg or 3 g in a day.) - Smoking cessation with Dr. Valentina Lucks - Follow up with cardiology appointment (discuss chest pain, heart murmur, echo results)  Take care and seek immediate care sooner if you develop any concerns.   Dr. Holley Bouche, MD [place]

## 2021-04-23 NOTE — Assessment & Plan Note (Signed)
Intrested in quitting -Will schedule appointment with Dr. Valentina Lucks

## 2021-04-23 NOTE — Assessment & Plan Note (Signed)
Has had only one episode of chest pain since last visit, that was brought on by lifting and relieved with rest. No need for nitro use. Taking all BP meds. Has appointment with cardiology for 10/26.  -Discussed Cardiology appointment goals  Chest pain  Heart Murmur  Echo results

## 2021-04-24 NOTE — Telephone Encounter (Signed)
Openned in error

## 2021-04-27 ENCOUNTER — Encounter: Payer: Self-pay | Admitting: Pharmacist

## 2021-04-27 ENCOUNTER — Ambulatory Visit (INDEPENDENT_AMBULATORY_CARE_PROVIDER_SITE_OTHER): Payer: Medicare HMO | Admitting: Pharmacist

## 2021-04-27 ENCOUNTER — Ambulatory Visit
Admission: RE | Admit: 2021-04-27 | Discharge: 2021-04-27 | Disposition: A | Discharge status: Home / Self Care | Payer: Medicare HMO | Source: Ambulatory Visit | Attending: Family Medicine | Admitting: Family Medicine

## 2021-04-27 ENCOUNTER — Other Ambulatory Visit: Payer: Self-pay

## 2021-04-27 DIAGNOSIS — Z720 Tobacco use: Secondary | ICD-10-CM | POA: Diagnosis not present

## 2021-04-27 DIAGNOSIS — M79605 Pain in left leg: Secondary | ICD-10-CM

## 2021-04-27 DIAGNOSIS — M25552 Pain in left hip: Secondary | ICD-10-CM | POA: Diagnosis not present

## 2021-04-27 MED ORDER — NICOTINE 21 MG/24HR TD PT24: 21.0000 mg | MEDICATED_PATCH | Freq: Every day | TRANSDERMAL | 2 refills | Status: DC

## 2021-04-27 NOTE — Patient Instructions (Addendum)
Nice to meet you today!  Congratulations on trying to quit smoking.   Start Nicotine 21mg  patch - once daily.   Follow-Up by phone call in ~ 2 weeks.

## 2021-04-27 NOTE — Assessment & Plan Note (Signed)
Tobacco use disorder with moderate nicotine dependence of ~50 years duration in a patient who is has smoked 1.5-2 ppd for many years.  He has cut down to ~ 1ppd and is currently an excellent candidate for success because of stated importance of quitting and history of quit success in the past.  -Initiated nicotine replacement tx with 21 mg nicotine patch. Patient counseled on purpose, proper use, and potential adverse effects, including vivid nightmares, insomnia, nausea. He stated that he has had issues with insurance coverage in the past, and was told if insurance is an issue then he can purchase OTC patches.  -Goal to get down to 5 or less cigarettes/day in the next couple of weeks. At next f/u will assess progress and potentially discuss a quit date.  -Counseled on additional tobacco reduction strategies, including stopping smoking in particular places, such as in the car.

## 2021-04-27 NOTE — Progress Notes (Signed)
Reviewed: I agree with Dr. Koval's documentation and management. 

## 2021-04-27 NOTE — Progress Notes (Signed)
   S:  Patient arrives in a pleasant mood, ambulating without assistance. Patient arrives for evaluation & assistance with tobacco dependence.   Patient was referred and last seen by Primary Care Provider Dr. Marcina Millard on 10/20.2022.   Age when started using tobacco on a daily basis 89. Brand smoked L&M Blue 100 lights. Number of cigarettes/day 20/day. Estimated nicotine content per cigarette (mg) 0.5 mg.   Currently, the patient's son is living with him until January of next year and states that having him there is good, as he does not smoke.   Reports issues sleeping, waking up on average 2x during the night for the last 5 years. Wakes up to go to the bathroom, and will sometimes smoke while up.   Estimated nicotine intake per day 10 mg.   Does not commonly wake up to smoke at night. However, he does report that he will smoke sometimes when up at night.   Most recent quit attempt 2019 (3 months smoke free with use of patch)  Longest time ever been tobacco free 3 years.  Medications used in past cessation efforts include: NRT (patches)  Rates IMPORTANCE of quitting tobacco on 1-10 scale of 9-10. Rates CONFIDENCE of quitting tobacco on 1-10 scale of 5.  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  He has cut down to ~ 1ppd  and is currently an excellent candidate for success because of stated importance of quitting and history of quit success in the past.  -Initiated nicotine replacement tx with 21 mg nicotine patch. Patient counseled on purpose, proper use, and potential adverse effects, including vivid nightmares, insomnia, nausea. He stated that he has had issues with insurance coverage in the past, and was told if insurance is an issue then he can purchase OTC patches.  -Goal to get down to 5 or less cigarettes/day in the next couple of weeks. At next f/u will assess progress and potentially discuss a quit date.  -Counseled on additional tobacco reduction strategies, including stopping smoking in particular places, such as in the car.   Written information provided.  F/U phone call in 2 weeks.Total time in face-to-face counseling 35 minutes.  Patient seen with Elyse Jarvis, PharmD Candidate. [place] Kitchen

## 2021-04-29 ENCOUNTER — Encounter: Payer: Self-pay | Admitting: Cardiovascular Disease

## 2021-04-29 ENCOUNTER — Ambulatory Visit: Payer: Medicare HMO | Admitting: Cardiovascular Disease

## 2021-04-29 ENCOUNTER — Other Ambulatory Visit: Payer: Self-pay

## 2021-04-29 VITALS — BP 164/92 | HR 69 | Ht 68.5 in | Wt 176.0 lb

## 2021-04-29 DIAGNOSIS — J432 Centrilobular emphysema: Secondary | ICD-10-CM

## 2021-04-29 DIAGNOSIS — R69 Illness, unspecified: Secondary | ICD-10-CM | POA: Diagnosis not present

## 2021-04-29 DIAGNOSIS — I700 Atherosclerosis of aorta: Secondary | ICD-10-CM

## 2021-04-29 DIAGNOSIS — I25118 Atherosclerotic heart disease of native coronary artery with other forms of angina pectoris: Secondary | ICD-10-CM

## 2021-04-29 DIAGNOSIS — E782 Mixed hyperlipidemia: Secondary | ICD-10-CM | POA: Diagnosis not present

## 2021-04-29 DIAGNOSIS — F17200 Nicotine dependence, unspecified, uncomplicated: Secondary | ICD-10-CM

## 2021-04-29 DIAGNOSIS — I10 Essential (primary) hypertension: Secondary | ICD-10-CM | POA: Diagnosis not present

## 2021-04-29 NOTE — Progress Notes (Signed)
Cardiology Office Note  Date:  04/29/2021   ID:  Joe Shannon, DOB 1951-02-12, MRN [id number]  PCP:  Holley Bouche, MD   Chief Complaint  Patient presents with   New Patient (Initial Visit)    Ref by Dr. Owens Shark for chest pain off & on for the past 8 months. Medications reviewed by the patient verbally.     HPI:  Joe Shannon is a 70 year old gentleman with past medical history of long smoking history Hyperlipidemia Hypertension Heavy coronary calcification on CT scan October 2021 Strong family history of coronary disease Who presents by referral from Dr. 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 Arnoldo Morale at that time, deferred on surgery  Reports his brother has coronary disease, mother also with bypass  Reports that he has met with a smoking specialist, trying to wean down on his smoking but still approximately 1 pack/day  Does not check his blood pressure at home Compliant with his blood pressure medications Tolerating Lipitor  EKG personally reviewed by myself on todays visit Normal sinus rhythm rate 69 bpm Nonspecific interventricular conduction delay QRS 126, less notable on repeat EKG   PMH:   has a past medical history of Closed lumbar vertebral fracture ([place]).  PSH:    Past Surgical History:  Procedure Laterality Date   COLONOSCOPY     HERNIA REPAIR     Ligunial hernia, both sides    VASECTOMY      Current Outpatient Medications  Medication Sig Dispense Refill   amLODipine (NORVASC) 10 MG tablet Take 1  tablet by mouth once daily 90 tablet 2   aspirin EC 81 MG tablet Take 1 tablet (81 mg total) by mouth daily. 30 tablet 4   atorvastatin (LIPITOR) 40 MG tablet Take 1 tablet by mouth once daily 90 tablet 2   lisinopril-hydrochlorothiazide (ZESTORETIC) 20-25 MG tablet Take 1 tablet by mouth once daily 90 tablet 2   Multiple Vitamins-Minerals (CENTRUM SILVER 50+MEN) TABS Take 1 Syringe by mouth daily.     nicotine (EQ NICOTINE) 21 mg/24hr patch Place 1 patch (21 mg total) onto the skin daily. (Patient not taking: Reported on 04/29/2021) 28 patch 2   nitroGLYCERIN (NITROSTAT) 0.4 MG SL tablet Place 1 tablet (0.4 mg total) under the tongue every 5 (five) minutes as needed for chest pain. Then call 911 if you use this medication (Patient not taking: No sig reported) 30 tablet 1   Zoster Vaccine Adjuvanted [facility]) injection Inject 0.5 ml IM and Repeat in 2 months (Patient not taking: Reported on 04/29/2021) 0.5 mL 1   No current facility-administered medications for this visit.     Allergies:   Patient has no known allergies.   Social History:  The patient  reports that he has been smoking cigarettes. He started smoking about 51 years ago. He has a 48.00 pack-year smoking history. He has never used smokeless tobacco. He reports current alcohol use of about 4.0 - 5.0 standard drinks per week. He reports that he does not use  drugs.   Family History:   family history includes Colon cancer in his mother; Diabetes in his father; Heart disease in his brother; Heart disease (age of onset: 70) in his mother; Hyperlipidemia in his brother; Hypertension in his brother, father, and mother; Stroke (age of onset: 49) in his father.    Review of Systems: Review of Systems  Constitutional: Negative.   HENT: Negative.    Respiratory: Negative.    Cardiovascular: Negative.   Gastrointestinal: Negative.   Musculoskeletal: Negative.   Neurological: Negative.   Psychiatric/Behavioral: Negative.    All other  systems reviewed and are negative.   PHYSICAL EXAM: VS:  BP (!) 164/92 (BP Location: Right Arm, Patient Position: Sitting, Cuff Size: Normal)   Pulse 69   Ht 5' 8.5" (1.74 m)   Wt 176 lb (79.8 kg)   SpO2 99%   BMI 26.37 kg/m  , BMI Body mass index is 26.37 kg/m. GEN: Well nourished, well developed, in no acute distress HEENT: normal Neck: no JVD, carotid bruits, or masses Cardiac: RRR; no murmurs, rubs, or gallops,no edema  Respiratory:  clear to auscultation bilaterally, normal work of breathing GI: soft, nontender, nondistended, + BS MS: no deformity or atrophy Skin: warm and dry, no rash Neuro:  Strength and sensation are intact Psych: euthymic mood, full affect   Recent Labs: 04/03/2021: BUN 10; Creatinine, Ser 0.75; Hemoglobin 13.9; Platelets 289; Potassium 4.3; Sodium 131    Lipid Panel Lab Results  Component Value Date   CHOL 191 04/03/2021   HDL 63 04/03/2021   LDLCALC 110 (H) 04/03/2021   TRIG 103 04/03/2021      Wt Readings from Last 3 Encounters:  04/29/21 176 lb (79.8 kg)  04/27/21 172 lb 12.8 oz (78.4 kg)  04/23/21 176 lb 6.4 oz (80 kg)     ASSESSMENT AND PLAN:  Problem List Items Addressed This Visit       Cardiology Problems   Aortic calcification (HCC)   Essential hypertension   Relevant Orders   EKG 12-Lead   Hyperlipidemia     Other   Emphysema lung ([place])   Other Visit Diagnoses     Coronary artery disease of native artery of native heart with stable angina pectoris ([place])    -  Primary   Relevant Orders   EKG 12-Lead   Smoker          Coronary disease with stable angina, unable to exclude unstable angina Classic anginal symptoms, coming on with exertion, High risk features including family history, hyperlipidemia, long history of smoking CT scan chest with very heavy three-vessel coronary calcification The above was discussed with him, treatment options including cardiac catheterization, cardiac CTA, Myoview Ideally he should  go straight to cardiac catheterization given the severity of his symptoms and high likelihood of ischemia.  This was discussed with him He is hesitant to proceed at this time, would like to talk to his brother who is had coronary disease and work-up -We have detailed everything in his discharge instruction for him to talk with his brother -Cardiac CTA would be less beneficial given diffuse severe coronary calcification that would make reading a cardiac CTA more difficult -Myoview third choice again less than ideal given the concern for balanced ischemia Medical management not a good option at this time given the chronicity and severity of the symptoms and high risk features -All of the above was discussed with him including risk and benefit of cardiac catheterization He will call us if he would  like to proceed  Essential hypertension Difficult to determine his baseline blood pressure Recommend he buy a blood pressure cuff, monitor closely at home and call us with some numbers in the next week or so  Hyperlipidemia Continue Lipitor, In follow-up will discuss adding Zetia to achieve goal LDL less than 70 On this regiment if not at goal would likely benefit from Lipitor 80 or Crestor 40 or consideration of PCSK9 inhibitor  COPD/emphysema .We have encouraged him to continue to work on weaning his cigarettes and smoking cessation. He will continue to work on this and does not want any assistance with chantix.      Total encounter time more than 60 minutes  Greater than 50% was spent in counseling and coordination of care with the patient    Signed, Esmond Plants, M.D., Ph.D. [place], [place]

## 2021-04-29 NOTE — Patient Instructions (Addendum)
CT scan shows: heavy 3 vessel coronary calcification Symptoms of chest pain: sounds like angina (blockage pain) Other factors that can cause blockages: Smoking Male Family hx High cholesterol   Medication Instructions:  No changes  If you need a refill on your cardiac medications before your next appointment, please call your pharmacy.   Lab work: No new labs needed  Testing/Procedures: Call the office to let us know what you would like to do: Option #1: heart cardiac cath (best option, fix the blockage)  Option #2 cardiac CT with contrast: show the blockage, but would not fix it  Option #3: nuclear stress test (does not give percentage of blockage)  Follow-Up: At [facility], you and your health needs are our priority.  As part of our continuing mission to provide you with exceptional heart care, we have created designated Provider Care Teams.  These Care Teams include your primary Cardiologist (physician) and Advanced Practice Providers (APPs -  Physician Assistants and Nurse Practitioners) who all work together to provide you with the care you need, when you need it.  You will need a follow up appointment in 1 month  Providers on your designated Care Team:   Murray Hodgkins, NP Christell Faith, PA-C Cadence Kathlen Mody, [place]  COVID-19 Vaccine Information can be found at: [email or web address] For questions related to vaccine distribution or appointments, please email [email or web address] or call [phone].   BUY BLOOD PRESSURE CUFF Please monitor blood pressures and keep a log of your readings.  Call the office with a week's worth of numbers and bring log at your next office visit   How to use a home blood pressure monitor. Be still. Measure at the same time every day. It's important to take the readings at the same time each day, such as morning and evening. Take reading approximately 1 1/2 to 2 hours after BP  medications.   AVOID these things for 30 minutes before checking your blood pressure: Drinking caffeine. Drinking alcohol. Eating. Smoking. Exercising.

## 2021-05-04 NOTE — Addendum Note (Signed)
Addended by: Holley Bouche T on: 05/04/2021 02:04 PM   Modules accepted: Orders

## 2021-05-12 ENCOUNTER — Telehealth: Payer: Self-pay | Admitting: Pharmacist

## 2021-05-12 NOTE — Telephone Encounter (Signed)
-----   Message from Leavy Cella, [place] sent at 04/27/2021 11:56 AM EDT ----- Regarding: Intake reduction with use of 21mg  nicotine patch Call in later afternoon if possible.

## 2021-05-12 NOTE — Telephone Encounter (Signed)
I agree with Dr Koval's assessment and plan. 

## 2021-05-12 NOTE — Telephone Encounter (Signed)
Patient contacted for follow/up of tobacco intake reduction attempt.   Since last contact patient reports he has cut down to ~ 10 cigarettes per day while using the nicotine 21mg  patches.  He states that his breathing has improved.  He denies any chance in sense of smell or taste.   We discussed continuation of nicotine 21mg  patch at this time.  He plans to reduce intake of cigarettes to 6-7 per day in the next 1-2 weeks.   Patient denies any significant side effects from tobacco cessation therapy.   Rates IMPORTANCE of quitting tobacco as high. Rates CONFIDENCE of quitting tobacco as good.  Most common triggers to use tobacco include; LESS with not smoking in car and trying to avoid smoking first thing in the AM with coffee.    Motivation to quit: breathing.     Total time with patient call and documentation of interaction: 11 minutes.  F/U Phone call planned: 2-3 weeks - consider patch dose reduction plan with complete cessation - quit date in mind.

## 2021-05-26 ENCOUNTER — Ambulatory Visit: Payer: Medicare HMO | Attending: Family Medicine

## 2021-05-26 ENCOUNTER — Other Ambulatory Visit: Payer: Self-pay

## 2021-05-26 DIAGNOSIS — M79605 Pain in left leg: Secondary | ICD-10-CM | POA: Insufficient documentation

## 2021-05-26 DIAGNOSIS — M1612 Unilateral primary osteoarthritis, left hip: Secondary | ICD-10-CM | POA: Insufficient documentation

## 2021-05-26 DIAGNOSIS — R262 Difficulty in walking, not elsewhere classified: Secondary | ICD-10-CM | POA: Insufficient documentation

## 2021-05-26 DIAGNOSIS — M25652 Stiffness of left hip, not elsewhere classified: Secondary | ICD-10-CM | POA: Diagnosis not present

## 2021-05-26 DIAGNOSIS — M6281 Muscle weakness (generalized): Secondary | ICD-10-CM | POA: Insufficient documentation

## 2021-05-26 DIAGNOSIS — M25552 Pain in left hip: Secondary | ICD-10-CM | POA: Insufficient documentation

## 2021-05-26 NOTE — Therapy (Signed)
[place] @ [address] [place], [place], [address] Phone: [phone]   Fax:  [phone]  Physical Therapy Evaluation  Patient Details  Name: Joe Shannon MRN: [id number] Date of Birth: 16-Aug-1950 Referring Provider (PT): Holley Bouche, MD   Encounter Date: 05/26/2021   PT End of Session - 05/26/21 1601     Visit Number 1    Date for PT Re-Evaluation 07/24/21    Authorization Type [employer]    PT Start Time 1400    PT Stop Time 1448    PT Time Calculation (min) 48 min    Activity Tolerance Patient tolerated treatment well    Behavior During Therapy [facility] for tasks assessed/performed             Past Medical History:  Diagnosis Date   Closed lumbar vertebral fracture [facility])     Past Surgical History:  Procedure Laterality Date   COLONOSCOPY     HERNIA REPAIR     Ligunial hernia, both sides    VASECTOMY      There were no vitals filed for this visit.    Subjective Assessment - 05/26/21 1404     Subjective Patient arrives with complaints of left hip pain and thigh pain.  He has some known degenerative changes in his low back.  He is unable to lift his leg out of the truck after riding for a long period of time.  He experiences post rest stiffness often.  He admits since he retired, he is more sedentary.  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  He lives alone.  He enjoys working outdoors and occasionally working on cars.  He would like to avoid surgery and be able to get up and walk without feeling pain.  Xrays reveal degenerative changes in the lumbar spine and left hip.    Limitations Standing;Lifting;Walking    How long can you sit comfortably? unlimited    How long can you stand comfortably? 30 min    How long can you walk comfortably? unlimited once he works through the start up pain     Diagnostic tests xrays : Degenerative changes lumbar spine and left hip    Currently in Pain? Yes    Pain Score 4     Pain Location Hip    Pain Orientation Left    Pain Descriptors / Indicators Aching    Pain Type Acute pain    Pain Onset 1 to 4 weeks ago    Pain Frequency Intermittent    Aggravating Factors  Post rest start up    Pain Relieving Factors Walking, heat    Effect of Pain on Daily Activities Difficulty with start up and getting out of his truck.    Multiple Pain Sites No                [facility] PT Assessment - 05/26/21 0001       Assessment   Medical Diagnosis Left hip OA    Referring Provider (PT) Holley Bouche, MD    Onset Date/Surgical Date 02/02/21    Hand Dominance Right    Next MD Visit none    Prior Therapy none      Precautions   Precautions None      Balance Screen   Has the patient fallen in the past 6 months No    Has  the patient had a decrease in activity level because of a fear of falling?  No    Is the patient reluctant to leave their home because of a fear of falling?  No      Home Environment   Living Environment Private residence    [place] to enter;Level entry    Entrance Stairs-Number of Steps 0    Home Layout Two level;Able to live on main level with bedroom/bathroom      Prior Function   Level of Independence Independent    Vocation Retired    Leisure working on cars, being outside      [employer]   Overall Cognitive Status Within Functional Limits for tasks assessed      Observation/Other Assessments   Observations healthy 70 y.o. male in no acute distress      Sensation   Light Touch Appears Intact      Functional Tests   Functional tests Sit to Stand      Sit to Stand   Comments with ease without UE support      Posture/Postural Control   Posture/Postural Control No significant limitations      ROM / Strength   AROM / PROM / Strength AROM;Strength      AROM   Overall  AROM  Deficits    Overall AROM Comments All LE ROM wfl with exception of left hip IR limted to 22 degress, lumbar ROM wfl      Strength   Overall Strength Deficits    Overall Strength Comments Left LE all 4+/5 with exception of hip abduction, IR and ER 4/5 on left vs 4+/5 on right      Flexibility   Soft Tissue Assessment /Muscle Length yes    Hamstrings limited to approx 45 degrees bilaterally      Special Tests    Special Tests Hip Special Tests    Hip Special Tests  Saralyn Pilar (FABER) Test      Saralyn Pilar [facility]) Test   Findings Positive    Side Left      Ambulation/Gait   Gait Pattern Step-through pattern;Antalgic                        Objective measurements completed on examination: See above findings.                PT Education - 05/26/21 1559     Education Details Educated patient on anatomy of the hip and how PT could help to improve his pain.  Initiated HEP.    Person(s) Educated Patient    Methods Explanation;Demonstration;Verbal cues;Handout    Comprehension Verbalized understanding;Returned demonstration;Verbal cues required              PT Short Term Goals - 05/26/21 1614       PT SHORT TERM GOAL #1   Title Independence with initial HEP    Time 4    Period Weeks    Status New    Target Date 06/23/21      PT SHORT TERM GOAL #2   Title Able to get out of truck without assisting his left hip with his hands    Time 4    Period Weeks    Status New    Target Date 06/23/21               PT Long Term Goals - 05/26/21 1616  PT LONG TERM GOAL #1   Title Independence with advanced HEP    Time 8    Period Weeks    Status New    Target Date 07/21/21      PT LONG TERM GOAL #2   Title Start up pain no greater than 1/10    Time 88    Period Weeks    Status New    Target Date 07/21/21      PT LONG TERM GOAL #3   Title Hip IR ROM to 30 degrees    Baseline 22    Time 8    Period Weeks    Status New    Target  Date 07/21/21      PT LONG TERM GOAL #4   Title Left hip strength to be 4+/5 throughout    Time 8    Period Weeks    Status New    Target Date 07/21/21                    Plan - 05/26/21 1602     Clinical Impression Statement Patient is a 70 y.o retired male who lives alone.  He has recently experienced onset of hip pain which is causing him discomfort upon standing and when getting out of his truck.  He states he must lift his thigh when exiting his truck.  He has history of low back pain and xrays do reveal degenerative changes in the lumbar spine and left hip.  He admits that he has become fairly sedentary since retiring.  He presents with weakness in left hip abduction and ER.  He is tight in both hip IR and ER.  He has antalgic gait upon start up.  Pain is located in the groin and left thight area.  He would benefit from skilled PT for Left LE hip flexibility and strengthening to stabilize hip and restore proper arthrokinematics.    Personal Factors and Comorbidities Comorbidity 1    Comorbidities Hx lumbar fracture, Htn    Examination-Activity Limitations Bend;Lift;Transfers    Examination-Participation Restrictions Community Activity;Yard Work    Stability/Clinical Decision Making Stable/Uncomplicated    Designer, jewellery Low    Rehab Potential Excellent    PT Frequency 1x / week    PT Duration 8 weeks    PT Treatment/Interventions ADLs/Self Care Home Management;Aquatic Therapy;Traction;Moist Heat;Iontophoresis 4mg /ml Dexamethasone;Electrical Stimulation;Cryotherapy;Ultrasound;Gait training;Therapeutic exercise;Therapeutic activities;Functional mobility training;Stair training;Balance training;Neuromuscular re-education;Patient/family education;Manual techniques;Passive range of motion;Taping;Energy conservation;Dry needling;Joint Manipulations    PT Next Visit Plan Review HEP.  Add in hip stability training.             Patient will benefit from skilled  therapeutic intervention in order to improve the following deficits and impairments:  Abnormal gait, Decreased balance, Decreased mobility, Difficulty walking, Increased muscle spasms, Decreased range of motion, Pain, Decreased strength  Visit Diagnosis: Pain in left hip - Plan: PT plan of care cert/re-cert  Primary osteoarthritis of left hip - Plan: PT plan of care cert/re-cert  Stiffness of left hip, not elsewhere classified - Plan: PT plan of care cert/re-cert  Difficulty in walking, not elsewhere classified - Plan: PT plan of care cert/re-cert  Muscle weakness (generalized) - Plan: PT plan of care cert/re-cert    [facility] PT Assessment - 05/26/21 0001       Assessment   Medical Diagnosis Left hip OA    Referring Provider (PT) Holley Bouche, MD    Onset Date/Surgical Date 02/02/21    Hand Dominance Right  Next MD Visit none    Prior Therapy none      Precautions   Precautions None      Balance Screen   Has the patient fallen in the past 6 months No    Has the patient had a decrease in activity level because of a fear of falling?  No    Is the patient reluctant to leave their home because of a fear of falling?  No      Home Environment   Living Environment Private residence    [place] to enter;Level entry    Entrance Stairs-Number of Steps 0    Home Layout Two level;Able to live on main level with bedroom/bathroom      Prior Function   Level of Independence Independent    Vocation Retired    Leisure working on cars, being outside      [employer]   Overall Cognitive Status Within Functional Limits for tasks assessed      Observation/Other Assessments   Observations healthy 70 y.o. male in no acute distress      Sensation   Light Touch Appears Intact      Functional Tests   Functional tests Sit to Stand      Sit to Stand   Comments with ease without UE support      Posture/Postural Control   Posture/Postural Control No  significant limitations      ROM / Strength   AROM / PROM / Strength AROM;Strength      AROM   Overall AROM  Deficits    Overall AROM Comments All LE ROM wfl with exception of left hip IR limted to 22 degress, lumbar ROM wfl      Strength   Overall Strength Deficits    Overall Strength Comments Left LE all 4+/5 with exception of hip abduction, IR and ER 4/5 on left vs 4+/5 on right      Flexibility   Soft Tissue Assessment /Muscle Length yes    Hamstrings limited to approx 45 degrees bilaterally      Special Tests    Special Tests Hip Special Tests    Hip Special Tests  Saralyn Pilar (FABER) Test      Saralyn Pilar [facility]) Test   Findings Positive    Side Left      Ambulation/Gait   Gait Pattern Step-through pattern;Antalgic              Problem List Patient Active Problem List   Diagnosis Date Noted   Chest pain 04/03/2021   Hearing loss 04/03/2021   Leg pain 04/03/2021   Atherosclerotic cardiovascular disease 04/27/2020   Aortic calcification ([place]) 04/27/2020   Emphysema lung ([place]) 04/25/2020   Pulmonary nodules 05/09/2018   Anemia 11/16/2017   BPH (benign prostatic hyperplasia) 03/08/2017   Hyperlipidemia 02/03/2017   Essential hypertension 01/24/2017   Tobacco abuse 01/24/2017    Anderson Malta B. Shaivi Rothschild, PT 11/22/225:26 PM   [place] @ [place], [place], [address] Phone: [phone]   Fax:  [phone]  Name: Joe Shannon MRN: [id number] Date of Birth: [birth year]

## 2021-05-26 NOTE — Patient Instructions (Signed)
Access Code: [email or web address] URL: [email or web address] Date: 05/26/2021 Prepared by: Candyce Churn  Exercises Supine Hamstring Stretch - 2 x daily - 7 x weekly - 1 sets - 3 reps - 30 sec hold Supine Piriformis Stretch with Foot on Ground - 2 x daily - 7 x weekly - 1 sets - 3 reps - 30 sec hold Supine Figure 4 Piriformis Stretch - 2 x daily - 7 x weekly - 1 sets - 3 reps - 30 sec hold Supine Hip Internal and External Rotation - 2 x daily - 7 x weekly - 1 sets - 3 reps - 30 sec hold Standing Quad Stretch with Table and Chair Support - 1 x daily - 7 x weekly - 1 sets - 3 reps - 30 sec hold

## 2021-06-01 NOTE — H&P (View-Only) (Signed)
Cardiology Office Note  Date:  06/02/2021   ID:  Joe Shannon, DOB 12-04-1950, MRN [id number]  PCP:  Holley Bouche, MD   Chief Complaint  Patient presents with   1 month follow up     "Doing well." Medications reviewed by the patient verbally.     HPI:  [name] is a 70 year old gentleman with past medical history of long smoking history Hyperlipidemia Hypertension Heavy coronary calcification on CT scan October 2021 Strong family history of coronary disease Who presents for f/u of his CAD  Seen 1 month ago at that time with worsening anginal symptoms We had discussed various options with him including cardiac catheterization, he needed time to think about what he would like to do  Presents again today to the office,Still with angina/chest pain Has been much less active Reports he cut back on his smoking, 1 pack every 3 days  CT scan chest  On today's visit, images pulled up heavy calcification of all 3 vessels Discussed that cardiac CTA may be less beneficial given high calcium burden  Reports that his brother just had stent put in, similar symptoms  Discussion of blood pressure, sometimes does not take his medications On Lipitor, unclear if he is taking this daily, discussed that numbers are above goal  Echo 10/22 reviewed  1. Left ventricular ejection fraction, by estimation, is 55 to 60%. The  left ventricle has normal function. The left ventricle has no regional  wall motion abnormalities. Left ventricular diastolic parameters are  consistent with Grade I diastolic  dysfunction (impaired relaxation).   2. Right ventricular systolic function is normal. The right ventricular  size is normal.   3. Left atrial size was mildly dilated.   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  PSH:    Past Surgical History:  Procedure Laterality Date   COLONOSCOPY     HERNIA REPAIR     Ligunial hernia, both sides    VASECTOMY      Current Outpatient Medications  Medication Sig Dispense Refill   amLODipine (NORVASC) 10 MG tablet Take 1 tablet by mouth once daily 90 tablet 2   aspirin EC 81 MG tablet Take 1 tablet (81 mg total) by mouth daily. 30 tablet 4   atorvastatin (LIPITOR) 40 MG tablet Take 1 tablet by mouth once daily 90 tablet 2   lisinopril-hydrochlorothiazide (ZESTORETIC) 20-25 MG tablet Take 1 tablet by mouth once daily 90 tablet 2   Multiple Vitamins-Minerals (CENTRUM SILVER 50+MEN) TABS Take 1 Syringe by mouth daily.     nicotine (EQ NICOTINE) 21 mg/24hr patch Place 1 patch (21 mg total) onto the skin daily. 28 patch 2   nitroGLYCERIN (NITROSTAT) 0.4 MG SL tablet Place 1 tablet (0.4 mg total) under the tongue every 5 (five) minutes as needed for chest pain. Then call 911 if you use this medication 30 tablet 1   Zoster Vaccine Adjuvanted [facility]) injection Inject 0.5 ml IM and Repeat in 2 months (Patient not taking:  Reported on 06/02/2021) 0.5 mL 1   No current facility-administered medications for this visit.     Allergies:   Patient has no known allergies.   Social History:  The patient  reports that he has been smoking cigarettes. He started smoking about 51 years ago. He has a 48.00 pack-year smoking history. He has never used smokeless tobacco. He reports current alcohol use of about 4.0 - 5.0 standard drinks per week. He reports that he does not use drugs.   Family History:   family history includes Colon cancer in his mother; Diabetes in his father; Heart  disease in his brother; Heart disease (age of onset: 38) in his mother; Hyperlipidemia in his brother; Hypertension in his brother, father, and mother; Stroke (age of onset: 22) in his father.    Review of Systems: Review of Systems  Constitutional: Negative.   HENT: Negative.    Respiratory: Negative.    Cardiovascular:  Positive for chest pain.  Gastrointestinal: Negative.   Musculoskeletal: Negative.   Neurological: Negative.   Psychiatric/Behavioral: Negative.    All other systems reviewed and are negative.   PHYSICAL EXAM: VS:  BP 134/88 (BP Location: Left Arm, Patient Position: Sitting, Cuff Size: Normal)   Pulse 83   Ht 5\' 8"  (1.727 m)   Wt 176 lb 8 oz (80.1 kg)   SpO2 99%   BMI 26.84 kg/m  , BMI Body mass index is 26.84 kg/m. Constitutional:  oriented to person, place, and time. No distress.  HENT:  Head: Grossly normal Eyes:  no discharge. No scleral icterus.  Neck: No JVD, no carotid bruits  Cardiovascular: Regular rate and rhythm, no murmurs appreciated Pulmonary/Chest: Clear to auscultation bilaterally, no wheezes or rails Abdominal: Soft.  no distension.  no tenderness.  Musculoskeletal: Normal range of motion Neurological:  normal muscle tone. Coordination normal. No atrophy Skin: Skin warm and dry Psychiatric: normal affect, pleasant  Recent Labs: 04/03/2021: BUN 10; Creatinine, Ser 0.75; Hemoglobin 13.9; Platelets 289; Potassium 4.3; Sodium 131    Lipid Panel Lab Results  Component Value Date   CHOL 191 04/03/2021   HDL 63 04/03/2021   LDLCALC 110 (H) 04/03/2021   TRIG 103 04/03/2021      Wt Readings from Last 3 Encounters:  06/02/21 176 lb 8 oz (80.1 kg)  04/29/21 176 lb (79.8 kg)  04/27/21 172 lb 12.8 oz (78.4 kg)     ASSESSMENT AND PLAN:  Problem List Items Addressed This Visit       Cardiology Problems   Essential hypertension   Hyperlipidemia     Other   Emphysema lung ([place])   Other Visit Diagnoses     Coronary artery disease  of native artery of native heart with stable angina pectoris ([place])    -  Primary   Smoker          Coronary disease with stable angina, unable to exclude unstable angina Classic anginal symptoms, coming on with exertion, High risk features including family history, hyperlipidemia, long history of smoking CT scan chest with very heavy three-vessel coronary calcification, images pulled up and reviewed with him today --We have recommended cardiac catheterization with I have reviewed the risks, indications, and alternatives to cardiac catheterization, possible angioplasty, and stenting with the patient. Risks include but are not limited to bleeding, infection, vascular injury, stroke, myocardial infection, arrhythmia, kidney injury, radiation-related injury in the case of prolonged fluoroscopy use, emergency cardiac surgery, and death. The patient understands the risks of serious complication is 1-2  in 1000 with diagnostic cardiac cath and 1-2% or less with angioplasty/stenting.  --He lives [place], would arrange cardiac catheterization in [place] Orders placed for repeat lab work  Essential hypertension Missing doses of his medications, has labile pressures 115 up to 150/160 Recommend he take his medications on a regular basis We will hold the lisinopril HCTZ given his low sodium Recommend he take lisinopril 40 with amlodipine 10 closely monitor pressure  Hyperlipidemia Continue Lipitor, Suspect may be missing some doses, may need to add Zetia Stressed importance of taking Lipitor daily  COPD/emphysema Still smoking We have encouraged him to continue to work on weaning his cigarettes and smoking cessation. He will continue to work on this and does not want any assistance with chantix.     Total encounter time more than 35 minutes  Greater than 50% was spent in counseling and coordination of care with the patient   Signed, Esmond Plants, M.D., Ph.D. [place], [place]

## 2021-06-01 NOTE — Progress Notes (Signed)
Cardiology Office Note  Date:  06/02/2021   ID:  GARRELL FLAGG, DOB May 12, 1951, MRN [id number]  PCP:  Holley Bouche, MD   Chief Complaint  Patient presents with   1 month follow up     "Doing well." Medications reviewed by the patient verbally.     HPI:  [name] is a 70 year old gentleman with past medical history of long smoking history Hyperlipidemia Hypertension Heavy coronary calcification on CT scan October 2021 Strong family history of coronary disease Who presents for f/u of his CAD  Seen 1 month ago at that time with worsening anginal symptoms We had discussed various options with him including cardiac catheterization, he needed time to think about what he would like to do  Presents again today to the office,Still with angina/chest pain Has been much less active Reports he cut back on his smoking, 1 pack every 3 days  CT scan chest  On today's visit, images pulled up heavy calcification of all 3 vessels Discussed that cardiac CTA may be less beneficial given high calcium burden  Reports that his brother just had stent put in, similar symptoms  Discussion of blood pressure, sometimes does not take his medications On Lipitor, unclear if he is taking this daily, discussed that numbers are above goal  Echo 10/22 reviewed  1. Left ventricular ejection fraction, by estimation, is 55 to 60%. The  left ventricle has normal function. The left ventricle has no regional  wall motion abnormalities. Left ventricular diastolic parameters are  consistent with Grade I diastolic  dysfunction (impaired relaxation).   2. Right ventricular systolic function is normal. The right ventricular  size is normal.   3. Left atrial size was mildly dilated.   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  PSH:    Past Surgical History:  Procedure Laterality Date   COLONOSCOPY     HERNIA REPAIR     Ligunial hernia, both sides    VASECTOMY      Current Outpatient Medications  Medication Sig Dispense Refill   amLODipine (NORVASC) 10 MG tablet Take 1 tablet by mouth once daily 90 tablet 2   aspirin EC 81 MG tablet Take 1 tablet (81 mg total) by mouth daily. 30 tablet 4   atorvastatin (LIPITOR) 40 MG tablet Take 1 tablet by mouth once daily 90 tablet 2   lisinopril-hydrochlorothiazide (ZESTORETIC) 20-25 MG tablet Take 1 tablet by mouth once daily 90 tablet 2   Multiple Vitamins-Minerals (CENTRUM SILVER 50+MEN) TABS Take 1 Syringe by mouth daily.     nicotine (EQ NICOTINE) 21 mg/24hr patch Place 1 patch (21 mg total) onto the skin daily. 28 patch 2   nitroGLYCERIN (NITROSTAT) 0.4 MG SL tablet Place 1 tablet (0.4 mg total) under the tongue every 5 (five) minutes as needed for chest pain. Then call 911 if you use this medication 30 tablet 1   Zoster Vaccine Adjuvanted [facility]) injection Inject 0.5 ml IM and Repeat in 2 months (Patient not taking:  Reported on 06/02/2021) 0.5 mL 1   No current facility-administered medications for this visit.     Allergies:   Patient has no known allergies.   Social History:  The patient  reports that he has been smoking cigarettes. He started smoking about 51 years ago. He has a 48.00 pack-year smoking history. He has never used smokeless tobacco. He reports current alcohol use of about 4.0 - 5.0 standard drinks per week. He reports that he does not use drugs.   Family History:   family history includes Colon cancer in his mother; Diabetes in his father; Heart  disease in his brother; Heart disease (age of onset: 2) in his mother; Hyperlipidemia in his brother; Hypertension in his brother, father, and mother; Stroke (age of onset: 37) in his father.    Review of Systems: Review of Systems  Constitutional: Negative.   HENT: Negative.    Respiratory: Negative.    Cardiovascular:  Positive for chest pain.  Gastrointestinal: Negative.   Musculoskeletal: Negative.   Neurological: Negative.   Psychiatric/Behavioral: Negative.    All other systems reviewed and are negative.   PHYSICAL EXAM: VS:  BP 134/88 (BP Location: Left Arm, Patient Position: Sitting, Cuff Size: Normal)   Pulse 83   Ht 5\' 8"  (1.727 m)   Wt 176 lb 8 oz (80.1 kg)   SpO2 99%   BMI 26.84 kg/m  , BMI Body mass index is 26.84 kg/m. Constitutional:  oriented to person, place, and time. No distress.  HENT:  Head: Grossly normal Eyes:  no discharge. No scleral icterus.  Neck: No JVD, no carotid bruits  Cardiovascular: Regular rate and rhythm, no murmurs appreciated Pulmonary/Chest: Clear to auscultation bilaterally, no wheezes or rails Abdominal: Soft.  no distension.  no tenderness.  Musculoskeletal: Normal range of motion Neurological:  normal muscle tone. Coordination normal. No atrophy Skin: Skin warm and dry Psychiatric: normal affect, pleasant  Recent Labs: 04/03/2021: BUN 10; Creatinine, Ser 0.75; Hemoglobin 13.9; Platelets 289; Potassium 4.3; Sodium 131    Lipid Panel Lab Results  Component Value Date   CHOL 191 04/03/2021   HDL 63 04/03/2021   LDLCALC 110 (H) 04/03/2021   TRIG 103 04/03/2021      Wt Readings from Last 3 Encounters:  06/02/21 176 lb 8 oz (80.1 kg)  04/29/21 176 lb (79.8 kg)  04/27/21 172 lb 12.8 oz (78.4 kg)     ASSESSMENT AND PLAN:  Problem List Items Addressed This Visit       Cardiology Problems   Essential hypertension   Hyperlipidemia     Other   Emphysema lung ([place])   Other Visit Diagnoses     Coronary artery disease  of native artery of native heart with stable angina pectoris ([place])    -  Primary   Smoker          Coronary disease with stable angina, unable to exclude unstable angina Classic anginal symptoms, coming on with exertion, High risk features including family history, hyperlipidemia, long history of smoking CT scan chest with very heavy three-vessel coronary calcification, images pulled up and reviewed with him today --We have recommended cardiac catheterization with I have reviewed the risks, indications, and alternatives to cardiac catheterization, possible angioplasty, and stenting with the patient. Risks include but are not limited to bleeding, infection, vascular injury, stroke, myocardial infection, arrhythmia, kidney injury, radiation-related injury in the case of prolonged fluoroscopy use, emergency cardiac surgery, and death. The patient understands the risks of serious complication is 1-2  in 1000 with diagnostic cardiac cath and 1-2% or less with angioplasty/stenting.  --He lives [place], would arrange cardiac catheterization in [place] Orders placed for repeat lab work  Essential hypertension Missing doses of his medications, has labile pressures 115 up to 150/160 Recommend he take his medications on a regular basis We will hold the lisinopril HCTZ given his low sodium Recommend he take lisinopril 40 with amlodipine 10 closely monitor pressure  Hyperlipidemia Continue Lipitor, Suspect may be missing some doses, may need to add Zetia Stressed importance of taking Lipitor daily  COPD/emphysema Still smoking We have encouraged him to continue to work on weaning his cigarettes and smoking cessation. He will continue to work on this and does not want any assistance with chantix.     Total encounter time more than 35 minutes  Greater than 50% was spent in counseling and coordination of care with the patient   Signed, Esmond Plants, M.D., Ph.D. [place], [place]

## 2021-06-02 ENCOUNTER — Encounter: Payer: Self-pay | Admitting: Cardiovascular Disease

## 2021-06-02 ENCOUNTER — Other Ambulatory Visit: Payer: Self-pay

## 2021-06-02 ENCOUNTER — Ambulatory Visit: Payer: Medicare HMO | Admitting: Cardiovascular Disease

## 2021-06-02 VITALS — BP 134/88 | HR 83 | Ht 68.0 in | Wt 176.5 lb

## 2021-06-02 DIAGNOSIS — I209 Angina pectoris, unspecified: Secondary | ICD-10-CM | POA: Diagnosis not present

## 2021-06-02 DIAGNOSIS — Z01818 Encounter for other preprocedural examination: Secondary | ICD-10-CM

## 2021-06-02 DIAGNOSIS — I25118 Atherosclerotic heart disease of native coronary artery with other forms of angina pectoris: Secondary | ICD-10-CM

## 2021-06-02 DIAGNOSIS — Z01810 Encounter for preprocedural cardiovascular examination: Secondary | ICD-10-CM | POA: Diagnosis not present

## 2021-06-02 DIAGNOSIS — J432 Centrilobular emphysema: Secondary | ICD-10-CM

## 2021-06-02 DIAGNOSIS — E782 Mixed hyperlipidemia: Secondary | ICD-10-CM | POA: Diagnosis not present

## 2021-06-02 DIAGNOSIS — F17200 Nicotine dependence, unspecified, uncomplicated: Secondary | ICD-10-CM

## 2021-06-02 DIAGNOSIS — I10 Essential (primary) hypertension: Secondary | ICD-10-CM | POA: Diagnosis not present

## 2021-06-02 DIAGNOSIS — R69 Illness, unspecified: Secondary | ICD-10-CM | POA: Diagnosis not present

## 2021-06-02 MED ORDER — LISINOPRIL 20 MG PO TABS
40.0000 mg | ORAL_TABLET | Freq: Every day | ORAL | 3 refills | Status: DC
Start: 2021-06-02 — End: 2022-07-12

## 2021-06-02 NOTE — Patient Instructions (Addendum)
Medication Instructions:  Please STOP lisinopril/ HCTZ Stop the HCTZ part, this is dropping sodium level Please START lisinopril 40 mg daily  If you need a refill on your cardiac medications before your next appointment, please call your pharmacy.    Lab work: CBC & BMP  Testing/Procedures: Heart catheterization 06/08/21 arrive at 05:30 am (Dr. Saunders Revel)  Follow-Up: At [facility], you and your health needs are our priority.  As part of our continuing mission to provide you with exceptional heart care, we have created designated Provider Care Teams.  These Care Teams include your primary Cardiologist (physician) and Advanced Practice Providers (APPs -  Physician Assistants and Nurse Practitioners) who all work together to provide you with the care you need, when you need it.  You will need a follow up appointment in 1 month  Providers on your designated Care Team:   Murray Hodgkins, NP Christell Faith, PA-C Cadence Kathlen Mody, [place]   COVID-19 Vaccine Information can be found at: ShippingScam.co.uk For questions related to vaccine distribution or appointments, please email vaccine@[email or web address] .[email or web address] or call [phone].   [facility] [address] [place], [place] [address] [phone] Please arrive at the [place] are scheduled for a Cardiac Cath on: 06/08/2021 (Monday) Please arrive at 05:30 am on the day of your procedure Your start time is 07:30 am  Please expect a call from our [place] to pre-register you Do not eat/drink anything after midnight Someone will need to drive you home It is recommended someone be with you for the first 24 hours after your procedure Wear clothes that are easy to get on/off and wear slip on shoes if possible  Please bring a current list of all medications with you  _X__ You may take all of your  medications the morning of your procedure with enough water to swallow safely Be sure to take aspirin 81 mg the morning of the hearth cath  ___ DO NOT take these medications before your procedure: none  For your safety and being able to monitor your condition, we request that they you do not wear nail polish, gel polish, artificial nails, or any other type of covering on natural nails including finger and toenails. If you have artificial nails, gel coating, etc.that requires to be removed by a nail saloon please have this removed prior to procedure. Your procedure may need to be canceled/ delayed if the performing provider/anesthesia team feels like the patient is unable to be adequately monitored.  Day of your procedure:  Please arrive at the [facility] main. Free valet service is available.  Please check-in at the registration desk to receive your armband. After receiving your armband someone will escort you to the cardiac cath/special procedures waiting area.  The usual length of stay after your procedure is about 2 to 3 hours.  This can vary.  If you have any questions, please call our office at [phone], or you may call the cardiac cath lab at [facility] directly at [phone]  Your physician has requested that you have a cardiac catheterization. Cardiac catheterization is used to diagnose and/or treat various heart conditions. Doctors may recommend this procedure for a number of different reasons. The most common reason is to evaluate chest pain. Chest pain can be a symptom of coronary artery disease (CAD), and cardiac catheterization can show whether plaque is narrowing or blocking your heart's arteries. This procedure is also used to evaluate the  valves, as well as measure the blood flow and oxygen levels in different parts of your heart. For further information please visit HugeFiesta.tn. Please follow instruction sheet, as given.

## 2021-06-03 ENCOUNTER — Ambulatory Visit: Payer: Medicare HMO

## 2021-06-03 ENCOUNTER — Other Ambulatory Visit: Payer: Self-pay | Admitting: Cardiovascular Disease

## 2021-06-03 DIAGNOSIS — M25552 Pain in left hip: Secondary | ICD-10-CM

## 2021-06-03 DIAGNOSIS — R262 Difficulty in walking, not elsewhere classified: Secondary | ICD-10-CM

## 2021-06-03 DIAGNOSIS — M6281 Muscle weakness (generalized): Secondary | ICD-10-CM

## 2021-06-03 DIAGNOSIS — I200 Unstable angina: Secondary | ICD-10-CM

## 2021-06-03 DIAGNOSIS — M79605 Pain in left leg: Secondary | ICD-10-CM | POA: Diagnosis not present

## 2021-06-03 DIAGNOSIS — M25652 Stiffness of left hip, not elsewhere classified: Secondary | ICD-10-CM

## 2021-06-03 DIAGNOSIS — M1612 Unilateral primary osteoarthritis, left hip: Secondary | ICD-10-CM

## 2021-06-03 LAB — BASIC METABOLIC PANEL
BUN/Creatinine Ratio: 15 (ref 10–24)
BUN: 12 mg/dL (ref 8–27)
CO2: 26 mmol/L (ref 20–29)
Calcium: 9.9 mg/dL (ref 8.6–10.2)
Chloride: 94 mmol/L — ABNORMAL LOW (ref 96–106)
Creatinine, Ser: 0.78 mg/dL (ref 0.76–1.27)
Glucose: 87 mg/dL (ref 70–99)
Potassium: 4.4 mmol/L (ref 3.5–5.2)
Sodium: 136 mmol/L (ref 134–144)
eGFR: 97 mL/min/{1.73_m2} (ref >59)

## 2021-06-03 LAB — CBC
Hematocrit: 38.2 % (ref 37.5–51.0)
Hemoglobin: 13.3 g/dL (ref 13.0–17.7)
MCH: 30.9 pg (ref 26.6–33.0)
MCHC: 34.8 g/dL (ref 31.5–35.7)
MCV: 89 fL (ref 79–97)
Platelets: 302 10*3/uL (ref 150–450)
RBC: 4.3 x10E6/uL (ref 4.14–5.80)
RDW: 12.2 % (ref 11.6–15.4)
WBC: 5.4 10*3/uL (ref 3.4–10.8)

## 2021-06-03 NOTE — Patient Instructions (Signed)
Reviewed HEP.  Encouraged patient to be diligent with stretches.

## 2021-06-03 NOTE — Therapy (Signed)
[facility] [address] [place], [place], [address] Phone: [phone]   Fax:  [phone]  Physical Therapy Treatment  Patient Details  Name: Joe Shannon MRN: [id number] Date of Birth: 1950/08/30 Referring Provider (PT): Holley Bouche, MD   Encounter Date: 06/03/2021   PT End of Session - 06/03/21 1001     Visit Number 2    Date for PT Re-Evaluation 07/24/21    Authorization Type [employer] Medicare    PT Start Time 0930    PT Stop Time 1008    PT Time Calculation (min) 38 min    Activity Tolerance Patient tolerated treatment well    Behavior During Therapy [facility] for tasks assessed/performed             Past Medical History:  Diagnosis Date   Closed lumbar vertebral fracture [facility])     Past Surgical History:  Procedure Laterality Date   COLONOSCOPY     HERNIA REPAIR     Ligunial hernia, both sides    VASECTOMY      There were no vitals filed for this visit.   Subjective Assessment - 06/03/21 0935     Subjective Patient states he still has post rest pain and has not done his exercises as diligently as he should.  He rates his pain at 6/10.    Limitations Standing;Lifting;Walking    How long can you sit comfortably? unlimited    How long can you stand comfortably? 30 min    How long can you walk comfortably? unlimited once he works through the start up pain    Diagnostic tests xrays : Degenerative changes lumbar spine and left hip    Currently in Pain? Yes    Pain Score 7     Pain Location Hip    Pain Orientation Upper;Left    Pain Descriptors / Indicators Aching;Sharp    Pain Type Acute pain    Pain Onset 1 to 4 weeks ago    Pain Frequency Intermittent                               [facility] Adult PT Treatment/Exercise - 06/03/21 0001       Exercises   Exercises Knee/Hip;Lumbar      Lumbar Exercises: Stretches   Active Hamstring Stretch Limitations with strap x 3 x 30 sec  left      Knee/Hip Exercises: Stretches   Active Hamstring Stretch Left;3 reps;30 seconds    ITB Stretch Left;3 reps;30 seconds    Piriformis Stretch Left;3 reps;30 seconds    Other Knee/Hip Stretches Hip IR stretch (for external rotators)    Other Knee/Hip Stretches Hip IR's stretch in piriforms position (press down on knee) 3 x 30 sec      Knee/Hip Exercises: Supine   Straight Leg Raises Strengthening;Left;1 set;20 reps      Knee/Hip Exercises: Sidelying   Hip ABduction Strengthening;Left;1 set;20 reps    Hip ADduction Strengthening;Left;1 set;20 reps      Knee/Hip Exercises: Prone   Hip Extension Strengthening;Left;1 set;20 reps                     PT Education - 06/03/21 1000     Education Details Reviewed HEP.  Educated on hip anatomy and what we are trying to accomplish with the stretches and strengthening.    Person(s) Educated Patient    Methods Explanation;Demonstration;Verbal cues  Comprehension Verbalized understanding;Returned demonstration;Verbal cues required              PT Short Term Goals - 05/26/21 1614       PT SHORT TERM GOAL #1   Title Independence with initial HEP    Time 4    Period Weeks    Status New    Target Date 06/23/21      PT SHORT TERM GOAL #2   Title Able to get out of truck without assisting his left hip with his hands    Time 4    Period Weeks    Status New    Target Date 06/23/21               PT Long Term Goals - 05/26/21 1616       PT LONG TERM GOAL #1   Title Independence with advanced HEP    Time 8    Period Weeks    Status New    Target Date 07/21/21      PT LONG TERM GOAL #2   Title Start up pain no greater than 1/10    Time 88    Period Weeks    Status New    Target Date 07/21/21      PT LONG TERM GOAL #3   Title Hip IR ROM to 30 degrees    Baseline 22    Time 8    Period Weeks    Status New    Target Date 07/21/21      PT LONG TERM GOAL #4   Title Left hip strength to be 4+/5  throughout    Time 8    Period Weeks    Status New    Target Date 07/21/21                   Plan - 06/03/21 1004     Clinical Impression Statement Patient arrives with limp and continued hip pain.  He admits he hasnt done his exercises consistently.  He needed moderate verbal cues on technique today.  He was able to do 4 way hip with min discomfort x 20 reps without rest break.  He would benefit from increased compliance with HEP and continued skilled PT for hip ROM, flexibility and strengthening for left hip to improve arthrokinematics and reduce post rest pain.    Personal Factors and Comorbidities Comorbidity 1    Comorbidities Hx lumbar fracture, Htn    Examination-Activity Limitations Bend;Lift;Transfers    Examination-Participation Restrictions Community Activity;Yard Work    Stability/Clinical Decision Making Stable/Uncomplicated    Designer, jewellery Low    Rehab Potential Excellent    PT Frequency 1x / week    PT Duration 8 weeks    PT Treatment/Interventions ADLs/Self Care Home Management;Aquatic Therapy;Traction;Moist Heat;Iontophoresis 4mg /ml Dexamethasone;Electrical Stimulation;Cryotherapy;Ultrasound;Gait training;Therapeutic exercise;Therapeutic activities;Functional mobility training;Stair training;Balance training;Neuromuscular re-education;Patient/family education;Manual techniques;Passive range of motion;Taping;Energy conservation;Dry needling;Joint Manipulations    PT Next Visit Plan Progress hip strengthening, continue stretches.  Patient may need more review. Add nu step.    PT Home Exercise Plan Access Code: [email or web address]             Patient will benefit from skilled therapeutic intervention in order to improve the following deficits and impairments:  Abnormal gait, Decreased balance, Decreased mobility, Difficulty walking, Increased muscle spasms, Decreased range of motion, Pain, Decreased strength  Visit Diagnosis: Pain in left hip  Primary  osteoarthritis of left hip  Stiffness of left hip, not elsewhere classified  Difficulty in walking, not elsewhere classified  Muscle weakness (generalized)     Problem List Patient Active Problem List   Diagnosis Date Noted   Chest pain 04/03/2021   Hearing loss 04/03/2021   Leg pain 04/03/2021   Atherosclerotic cardiovascular disease 04/27/2020   Aortic calcification ([place]) 04/27/2020   Emphysema lung ([place]) 04/25/2020   Pulmonary nodules 05/09/2018   Anemia 11/16/2017   BPH (benign prostatic hyperplasia) 03/08/2017   Hyperlipidemia 02/03/2017   Essential hypertension 01/24/2017   Tobacco abuse 01/24/2017    Anderson Malta B. Marke Goodwyn, PT 06/03/2209:14 AM   [facility] [place], [place], [address] Phone: [phone]   Fax:  [phone]  Name: Joe Shannon MRN: [id number] Date of Birth: 01-04-1951

## 2021-06-04 ENCOUNTER — Other Ambulatory Visit: Payer: Self-pay | Admitting: Family Medicine

## 2021-06-04 ENCOUNTER — Telehealth: Payer: Self-pay | Admitting: *Deleted

## 2021-06-04 DIAGNOSIS — I10 Essential (primary) hypertension: Secondary | ICD-10-CM

## 2021-06-04 DIAGNOSIS — E785 Hyperlipidemia, unspecified: Secondary | ICD-10-CM

## 2021-06-04 NOTE — Telephone Encounter (Signed)
Cardiac catheterization scheduled at [facility] for: Monday June 08, 2021 7:30 AM [facility] Main Entrance A [facility]) at: 5:30 AM   Diet-no solid food after midnight prior to cath, clear liquids until 5 AM day of procedure.  Medication instructions for procedure: -Usual morning medications can be taken pre-cath with sips of water including aspirin 81 mg.    Confirmed patient has responsible adult to drive home post procedure and be with patient first 24 hours after arriving home.  [facility] does allow one visitor to accompany you and wait in the hospital waiting room while you are there for your procedure. You and your visitor will be asked to wear a mask once you enter the hospital.   Patient reports does not currently have any new symptoms concerning for COVID-19 and no household members with COVID-19 like illness.    Reviewed procedure/mask/visitor instructions with patient.

## 2021-06-08 ENCOUNTER — Ambulatory Visit (HOSPITAL_COMMUNITY)
Admission: RE | Admit: 2021-06-08 | Discharge: 2021-06-08 | Disposition: A | Discharge status: Home / Self Care | Payer: Medicare HMO | Attending: Internal Medicine | Admitting: Internal Medicine

## 2021-06-08 ENCOUNTER — Encounter (HOSPITAL_COMMUNITY)
Admission: RE | Disposition: A | Discharge status: Home / Self Care | Payer: Self-pay | Source: Home / Self Care | Attending: Internal Medicine

## 2021-06-08 ENCOUNTER — Other Ambulatory Visit: Payer: Self-pay

## 2021-06-08 ENCOUNTER — Encounter (HOSPITAL_COMMUNITY): Payer: Self-pay | Admitting: Internal Medicine

## 2021-06-08 DIAGNOSIS — I2510 Atherosclerotic heart disease of native coronary artery without angina pectoris: Secondary | ICD-10-CM | POA: Diagnosis not present

## 2021-06-08 DIAGNOSIS — R69 Illness, unspecified: Secondary | ICD-10-CM | POA: Diagnosis not present

## 2021-06-08 DIAGNOSIS — I200 Unstable angina: Secondary | ICD-10-CM

## 2021-06-08 DIAGNOSIS — F17210 Nicotine dependence, cigarettes, uncomplicated: Secondary | ICD-10-CM | POA: Diagnosis not present

## 2021-06-08 DIAGNOSIS — Z8249 Family history of ischemic heart disease and other diseases of the circulatory system: Secondary | ICD-10-CM | POA: Diagnosis not present

## 2021-06-08 DIAGNOSIS — E785 Hyperlipidemia, unspecified: Secondary | ICD-10-CM | POA: Diagnosis not present

## 2021-06-08 DIAGNOSIS — I10 Essential (primary) hypertension: Secondary | ICD-10-CM | POA: Insufficient documentation

## 2021-06-08 DIAGNOSIS — I25118 Atherosclerotic heart disease of native coronary artery with other forms of angina pectoris: Secondary | ICD-10-CM | POA: Insufficient documentation

## 2021-06-08 DIAGNOSIS — I208 Other forms of angina pectoris: Secondary | ICD-10-CM | POA: Diagnosis present

## 2021-06-08 DIAGNOSIS — J449 Chronic obstructive pulmonary disease, unspecified: Secondary | ICD-10-CM | POA: Insufficient documentation

## 2021-06-08 HISTORY — PX: LEFT HEART CATH AND CORONARY ANGIOGRAPHY: CATH118249

## 2021-06-08 SURGERY — LEFT HEART CATH AND CORONARY ANGIOGRAPHY
Anesthesia: LOCAL

## 2021-06-08 MED ORDER — ASPIRIN 81 MG PO CHEW: 81.0000 mg | CHEWABLE_TABLET | ORAL | Status: DC

## 2021-06-08 MED ORDER — FENTANYL CITRATE (PF) 100 MCG/2ML IJ SOLN
INTRAMUSCULAR | Status: DC | PRN
  Administered 2021-06-08: 50 ug via INTRAVENOUS

## 2021-06-08 MED ORDER — LIDOCAINE HCL (PF) 1 % IJ SOLN
INTRAMUSCULAR | Status: DC | PRN
  Administered 2021-06-08: 2 mL

## 2021-06-08 MED ORDER — SODIUM CHLORIDE 0.9 % IV SOLN: INTRAVENOUS | Status: DC

## 2021-06-08 MED ORDER — HEPARIN SODIUM (PORCINE) 1000 UNIT/ML IJ SOLN
INTRAMUSCULAR | Status: AC
  Filled 2021-06-08: qty 10

## 2021-06-08 MED ORDER — HYDRALAZINE HCL 20 MG/ML IJ SOLN: 10.0000 mg | INTRAMUSCULAR | Status: DC | PRN

## 2021-06-08 MED ORDER — LIDOCAINE HCL (PF) 1 % IJ SOLN
INTRAMUSCULAR | Status: AC
  Filled 2021-06-08: qty 30

## 2021-06-08 MED ORDER — SODIUM CHLORIDE 0.9% FLUSH: 3.0000 mL | INTRAVENOUS | Status: DC | PRN

## 2021-06-08 MED ORDER — HEPARIN (PORCINE) IN NACL 1000-0.9 UT/500ML-% IV SOLN
INTRAVENOUS | Status: AC
  Filled 2021-06-08: qty 1000

## 2021-06-08 MED ORDER — IOHEXOL 350 MG/ML SOLN
INTRAVENOUS | Status: DC | PRN
  Administered 2021-06-08: 45 mL

## 2021-06-08 MED ORDER — LABETALOL HCL 5 MG/ML IV SOLN: 10.0000 mg | INTRAVENOUS | Status: DC | PRN

## 2021-06-08 MED ORDER — HEPARIN (PORCINE) IN NACL 1000-0.9 UT/500ML-% IV SOLN
INTRAVENOUS | Status: DC | PRN
  Administered 2021-06-08 (×2): 500 mL

## 2021-06-08 MED ORDER — SODIUM CHLORIDE 0.9% FLUSH: 3.0000 mL | Freq: Two times a day (BID) | INTRAVENOUS | Status: DC

## 2021-06-08 MED ORDER — FENTANYL CITRATE (PF) 100 MCG/2ML IJ SOLN
INTRAMUSCULAR | Status: AC
  Filled 2021-06-08: qty 2

## 2021-06-08 MED ORDER — MIDAZOLAM HCL 2 MG/2ML IJ SOLN
INTRAMUSCULAR | Status: AC
  Filled 2021-06-08: qty 2

## 2021-06-08 MED ORDER — VERAPAMIL HCL 2.5 MG/ML IV SOLN
INTRAVENOUS | Status: AC
  Filled 2021-06-08: qty 2

## 2021-06-08 MED ORDER — ONDANSETRON HCL 4 MG/2ML IJ SOLN: 4.0000 mg | Freq: Four times a day (QID) | INTRAMUSCULAR | Status: DC | PRN

## 2021-06-08 MED ORDER — SODIUM CHLORIDE 0.9 % IV SOLN: 250.0000 mL | INTRAVENOUS | Status: DC | PRN

## 2021-06-08 MED ORDER — HEPARIN SODIUM (PORCINE) 1000 UNIT/ML IJ SOLN
INTRAMUSCULAR | Status: DC | PRN
  Administered 2021-06-08: 4000 [IU] via INTRAVENOUS

## 2021-06-08 MED ORDER — MIDAZOLAM HCL 2 MG/2ML IJ SOLN
INTRAMUSCULAR | Status: DC | PRN
  Administered 2021-06-08: 1 mg via INTRAVENOUS

## 2021-06-08 MED ORDER — SODIUM CHLORIDE 0.9 % WEIGHT BASED INFUSION: 1.0000 mL/kg/h | INTRAVENOUS | Status: DC

## 2021-06-08 MED ORDER — SODIUM CHLORIDE 0.9 % WEIGHT BASED INFUSION
3.0000 mL/kg/h | INTRAVENOUS | Status: AC
  Administered 2021-06-08: 3 mL/kg/h via INTRAVENOUS

## 2021-06-08 MED ORDER — ACETAMINOPHEN 325 MG PO TABS: 650.0000 mg | ORAL_TABLET | ORAL | Status: DC | PRN

## 2021-06-08 MED ORDER — VERAPAMIL HCL 2.5 MG/ML IV SOLN
INTRAVENOUS | Status: DC | PRN
  Administered 2021-06-08: 10 mL via INTRA_ARTERIAL

## 2021-06-08 SURGICAL SUPPLY — 9 items

## 2021-06-08 NOTE — Interval H&P Note (Signed)
History and Physical Interval Note:  06/08/2021 7:16 AM  Joe Shannon  has presented today for surgery, with the diagnosis of stable angina.  The various methods of treatment have been discussed with the patient and family. After consideration of risks, benefits and other options for treatment, the patient has consented to  Procedure(s): LEFT HEART CATH AND CORONARY ANGIOGRAPHY (N/A) as a surgical intervention.  The patient's history has been reviewed, patient examined, no change in status, stable for surgery.  I have reviewed the patient's chart and labs.  Questions were answered to the patient's satisfaction.    Cath Lab Visit (complete for each Cath Lab visit)  Clinical Evaluation Leading to the Procedure:   ACS: No.  Non-ACS:    Anginal Classification: CCS II  Anti-ischemic medical therapy: Minimal Therapy (1 class of medications)  Non-Invasive Test Results: No non-invasive testing performed  Prior CABG: No previous CABG   Joe Shannon

## 2021-06-11 ENCOUNTER — Telehealth: Payer: Self-pay | Admitting: Pharmacist

## 2021-06-11 NOTE — Telephone Encounter (Signed)
Attempted to contact patient for follow-up of tobacco intake reduction / cessation.    Left HIPAA compliant voice mail that I plan to try again in a few days.  (Note:  Recent Heart Catheterization - possible motivation to quit completely?)   Total time with patient call and documentation of interaction: 4 minutes.  Additional F/U Phone call planned: 4 days

## 2021-06-11 NOTE — Telephone Encounter (Signed)
-----   Message from Leavy Cella, [place] sent at 05/12/2021 10:09 AM EST ----- Regarding: Intake reduction reduced from 10 to 6-7 with use of 21mg  nicotine patch.

## 2021-06-11 NOTE — Telephone Encounter (Signed)
Noted and agree.  Recent heart cath shows non obstructive CAD (now secondary prevention. )

## 2021-06-15 ENCOUNTER — Telehealth: Payer: Self-pay | Admitting: Pharmacist

## 2021-06-15 NOTE — Telephone Encounter (Signed)
Attempted to contact patient for follow-up of tobacco intake reduction / cessation - 2x.    Following second attempt, left HIPAA compliant voice mail requesting call back to direct phone: [phone]   Total time with patient call and documentation of interaction: 9 minutes.  Additional F/U Phone call planned: 1 week

## 2021-06-15 NOTE — Telephone Encounter (Signed)
-----   Message from Leavy Cella, [place] sent at 06/11/2021  2:01 PM EST ----- Regarding: Tobacco Cessation

## 2021-06-16 ENCOUNTER — Other Ambulatory Visit: Payer: Self-pay

## 2021-06-16 ENCOUNTER — Ambulatory Visit: Payer: Medicare HMO | Attending: Family Medicine

## 2021-06-16 DIAGNOSIS — M25652 Stiffness of left hip, not elsewhere classified: Secondary | ICD-10-CM | POA: Insufficient documentation

## 2021-06-16 DIAGNOSIS — M6281 Muscle weakness (generalized): Secondary | ICD-10-CM | POA: Diagnosis not present

## 2021-06-16 DIAGNOSIS — R262 Difficulty in walking, not elsewhere classified: Secondary | ICD-10-CM | POA: Diagnosis not present

## 2021-06-16 DIAGNOSIS — M1612 Unilateral primary osteoarthritis, left hip: Secondary | ICD-10-CM | POA: Diagnosis not present

## 2021-06-16 DIAGNOSIS — M25552 Pain in left hip: Secondary | ICD-10-CM | POA: Diagnosis not present

## 2021-06-16 NOTE — Therapy (Signed)
[facility] @ [place] [place], [place], [address] Phone: [phone]   Fax:  [phone]  Physical Therapy Treatment  Patient Details  Name: Joe Shannon MRN: [id number] Date of Birth: [birth year] Referring Provider (PT): Holley Bouche, MD   Encounter Date: 06/16/2021   PT End of Session - 06/16/21 1014     Visit Number 3    Date for PT Re-Evaluation 07/24/21    Authorization Type Aetna Medicare    PT Start Time 0930    PT Stop Time 1013    PT Time Calculation (min) 43 min    Activity Tolerance Patient tolerated treatment well    Behavior During Therapy [facility] for tasks assessed/performed             Past Medical History:  Diagnosis Date   Closed lumbar vertebral fracture [facility])     Past Surgical History:  Procedure Laterality Date   COLONOSCOPY     HERNIA REPAIR     Ligunial hernia, both sides    LEFT HEART CATH AND CORONARY ANGIOGRAPHY N/A 06/08/2021   Procedure: LEFT HEART CATH AND CORONARY ANGIOGRAPHY;  Surgeon: Nelva Bush, MD;  Location: [facility];  Service: Cardiovascular;  Laterality: N/A;   VASECTOMY      There were no vitals filed for this visit.   Subjective Assessment - 06/16/21 0930     Subjective Patient states he is feeling relief after being more diligent with his HEP.  He had a heart cath on last Monday and was given some instruction on protecting the right arm for a few days so he had to hold off on a few of his exercises.    Limitations Standing;Lifting;Walking    How long can you sit comfortably? unlimited    How long can you stand comfortably? 30 min    How long can you walk comfortably? unlimited once he works through the start up pain    Diagnostic tests xrays : Degenerative changes lumbar spine and left hip    Currently in Pain? No/denies    Pain Onset 1 to 4 weeks ago                               [facility] Adult PT Treatment/Exercise - 06/16/21  0001       Exercises   Exercises Knee/Hip;Lumbar      Lumbar Exercises: Stretches   Active Hamstring Stretch Left;3 reps;30 seconds    Active Hamstring Stretch Limitations standing with heel on table holding onto back of chair    Hip Flexor Stretch Left;3 reps;30 seconds    Hip Flexor Stretch Limitations standing with foot in chair behind him holding onto treadmill    Piriformis Stretch Left;3 reps;30 seconds    Piriformis Stretch Limitations Supine    Other Lumbar Stretch Exercise seated butterfly stretch 3 x 30 sec      Knee/Hip Exercises: Stretches   Other Knee/Hip Stretches Hip IR stretch (for external rotators)    Other Knee/Hip Stretches Hip IR's stretch in piriforms position (press down on knee) 3 x 30 sec      Knee/Hip Exercises: Machines for Strengthening   Hip Cybex left hip abduction and extension x 20 55 lb      Knee/Hip Exercises: Standing   Walking with Sports Cord LE band walks with yellow loop one lap of 15 feet      Knee/Hip Exercises: Seated  Other Seated Knee/Hip Exercises Hip IR strengthening ball between knees, yellow loop around ankles      Knee/Hip Exercises: Sidelying   Clams yelllow loop x 20 left hip                       PT Short Term Goals - 05/26/21 1614       PT SHORT TERM GOAL #1   Title Independence with initial HEP    Time 4    Period Weeks    Status New    Target Date 06/23/21      PT SHORT TERM GOAL #2   Title Able to get out of truck without assisting his left hip with his hands    Time 4    Period Weeks    Status New    Target Date 06/23/21               PT Long Term Goals - 05/26/21 1616       PT LONG TERM GOAL #1   Title Independence with advanced HEP    Time 8    Period Weeks    Status New    Target Date 07/21/21      PT LONG TERM GOAL #2   Title Start up pain no greater than 1/10    Time 88    Period Weeks    Status New    Target Date 07/21/21      PT LONG TERM GOAL #3   Title Hip IR ROM  to 30 degrees    Baseline 22    Time 8    Period Weeks    Status New    Target Date 07/21/21      PT LONG TERM GOAL #4   Title Left hip strength to be 4+/5 throughout    Time 8    Period Weeks    Status New    Target Date 07/21/21                   Plan - 06/16/21 1015     Clinical Impression Statement Patient is responding very well to stretches and has been more compliant. He arrived with much improved gait pattern.  He was able to tolerate more aggressive stretches as well.  He should continue to do well and would benefit from continued skilled PT for left hip stretching and stabilization to slow progression of degenerative hip issues.    Personal Factors and Comorbidities Comorbidity 1    Comorbidities Hx lumbar fracture, Htn    Examination-Activity Limitations Bend;Lift;Transfers    Examination-Participation Restrictions Community Activity;Yard Work    Stability/Clinical Decision Making Stable/Uncomplicated    Designer, jewellery Low    Rehab Potential Excellent    PT Frequency 1x / week    PT Duration 8 weeks    PT Treatment/Interventions ADLs/Self Care Home Management;Aquatic Therapy;Traction;Moist Heat;Iontophoresis 4mg /ml Dexamethasone;Electrical Stimulation;Cryotherapy;Ultrasound;Gait training;Therapeutic exercise;Therapeutic activities;Functional mobility training;Stair training;Balance training;Neuromuscular re-education;Patient/family education;Manual techniques;Passive range of motion;Taping;Energy conservation;Dry needling;Joint Manipulations    PT Next Visit Plan Progress hip strengthening, continue stretches.  Continue nu step and increase to level 5.    PT Home Exercise Plan Access Code: [email or web address]             Patient will benefit from skilled therapeutic intervention in order to improve the following deficits and impairments:  Abnormal gait, Decreased balance, Decreased mobility, Difficulty walking, Increased muscle spasms, Decreased range of  motion, Pain, Decreased strength  Visit Diagnosis: Pain  in left hip  Primary osteoarthritis of left hip  Stiffness of left hip, not elsewhere classified  Difficulty in walking, not elsewhere classified  Muscle weakness (generalized)     Problem List Patient Active Problem List   Diagnosis Date Noted   Stable angina ([place]) 06/08/2021   Chest pain 04/03/2021   Hearing loss 04/03/2021   Leg pain 04/03/2021   Atherosclerotic cardiovascular disease 04/27/2020   Aortic calcification ([place]) 04/27/2020   Emphysema lung ([place]) 04/25/2020   Pulmonary nodules 05/09/2018   Anemia 11/16/2017   BPH (benign prostatic hyperplasia) 03/08/2017   Hyperlipidemia 02/03/2017   Essential hypertension 01/24/2017   Tobacco abuse 01/24/2017    Anderson Malta B. Tarvis Blossom, PT 06/16/2209:20 AM   [facility] @ [place], [place], [address] Phone: [phone]   Fax:  [phone]  Name: DEMARKO ZEIMET MRN: [id number] Date of Birth: [birth year]

## 2021-06-16 NOTE — Patient Instructions (Signed)
Added standing hamstring stretch to HEP in the event he is outdoors and is feeling some hip tightness while he is out working.

## 2021-06-22 ENCOUNTER — Telehealth: Payer: Self-pay | Admitting: Pharmacist

## 2021-06-22 NOTE — Telephone Encounter (Signed)
-----   Message from Leavy Cella, [place] sent at 06/15/2021  1:44 PM EST ----- Regarding: Tobacco No answer X2

## 2021-06-22 NOTE — Telephone Encounter (Signed)
Attempted to contact patient for follow-up of tobacco intake reduction / cessation.   No answer X 3 - No additional phone follow-up planned unless patient initiated.  Left HIPAA compliant voice mail requesting call back to direct phone: [phone]   Total time with patient call and documentation of interaction: 7 minutes.  Additional F/U Phone call planned: None at this time.  Please refer back if interested.

## 2021-06-23 ENCOUNTER — Ambulatory Visit: Payer: Medicare HMO

## 2021-06-23 ENCOUNTER — Other Ambulatory Visit: Payer: Self-pay

## 2021-06-23 DIAGNOSIS — M6281 Muscle weakness (generalized): Secondary | ICD-10-CM | POA: Diagnosis not present

## 2021-06-23 DIAGNOSIS — R262 Difficulty in walking, not elsewhere classified: Secondary | ICD-10-CM | POA: Diagnosis not present

## 2021-06-23 DIAGNOSIS — M25652 Stiffness of left hip, not elsewhere classified: Secondary | ICD-10-CM | POA: Diagnosis not present

## 2021-06-23 DIAGNOSIS — M1612 Unilateral primary osteoarthritis, left hip: Secondary | ICD-10-CM | POA: Diagnosis not present

## 2021-06-23 DIAGNOSIS — M25552 Pain in left hip: Secondary | ICD-10-CM

## 2021-06-23 NOTE — Therapy (Addendum)
[facility] @ [facility] [address] [place], [place], [address] Phone: [phone]   Fax:  [phone]  Physical Therapy Treatment (DC on 10/15/21)  Patient Details  Name: Joe Shannon MRN: [id number] Date of Birth: 1951-01-16 Referring Provider (PT): Bess Kinds, MD   Encounter Date: 06/23/2021   PT End of Session - 06/23/21 1046     Visit Number 4    Date for PT Re-Evaluation 07/24/21    Authorization Type Aetna Medicare    PT Start Time 1015    PT Stop Time 1058    PT Time Calculation (min) 43 min    Activity Tolerance Patient tolerated treatment well    Behavior During Therapy [facility] for tasks assessed/performed             Past Medical History:  Diagnosis Date   Closed lumbar vertebral fracture (HCC)     Past Surgical History:  Procedure Laterality Date   COLONOSCOPY     HERNIA REPAIR     Ligunial hernia, both sides    LEFT HEART CATH AND CORONARY ANGIOGRAPHY N/A 06/08/2021   Procedure: LEFT HEART CATH AND CORONARY ANGIOGRAPHY;  Surgeon: Yvonne Kendall, MD;  Location: [facility];  Service: Cardiovascular;  Laterality: N/A;   VASECTOMY      There were no vitals filed for this visit.   Subjective Assessment - 06/23/21 1023     Subjective Patient states the hips are feeling pretty good but that his knees are a little shaky when he was coming down steps since last visit.    Limitations Standing;Lifting;Walking    How long can you sit comfortably? unlimited    How long can you stand comfortably? 30 min    How long can you walk comfortably? unlimited once he works through the start up pain    Diagnostic tests xrays : Degenerative changes lumbar spine and left hip    Currently in Pain? No/denies    Pain Onset 1 to 4 weeks ago                               [facility] Adult PT Treatment/Exercise - 06/23/21 0001       Lumbar Exercises: Stretches   Active Hamstring Stretch Left;2 reps;30  seconds    Active Hamstring Stretch Limitations standing at steps    Hip Flexor Stretch Left;2 reps;30 seconds    Hip Flexor Stretch Limitations standing with foot in chair behind him holding onto treadmill    Piriformis Stretch Left;3 reps;30 seconds    Piriformis Stretch Limitations Supine    Other Lumbar Stretch Exercise seated butterfly stretch 3 x 30 sec      Knee/Hip Exercises: Aerobic   Recumbent Bike 5 min level      Knee/Hip Exercises: Machines for Strengthening   Hip Cybex left hip abduction and extension x 20 55 lb      Knee/Hip Exercises: Standing   Walking with Sports Cord LE band walks with yellow loop one lap of 15 feet      Knee/Hip Exercises: Seated   Other Seated Knee/Hip Exercises Hip IR strengthening ball between knees, yellow loop around ankles                       PT Short Term Goals - 06/23/21 1143       PT SHORT TERM GOAL #1   Title Independence with initial HEP  Time 4    Period Weeks    Status Achieved    Target Date 06/23/21      PT SHORT TERM GOAL #2   Title Able to get out of truck without assisting his left hip with his hands    Time 4    Period Weeks    Status Partially Met    Target Date 06/23/21               PT Long Term Goals - 05/26/21 1616       PT LONG TERM GOAL #1   Title Independence with advanced HEP    Time 8    Period Weeks    Status New    Target Date 07/21/21      PT LONG TERM GOAL #2   Title Start up pain no greater than 1/10    Time 88    Period Weeks    Status New    Target Date 07/21/21      PT LONG TERM GOAL #3   Title Hip IR ROM to 30 degrees    Baseline 22    Time 8    Period Weeks    Status New    Target Date 07/21/21      PT LONG TERM GOAL #4   Title Left hip strength to be 4+/5 throughout    Time 8    Period Weeks    Status New    Target Date 07/21/21                   Plan - 06/23/21 1051     Clinical Impression Statement Joe Shannon continues to improve.  He  did, however, have some weakness in eccentric activities at home such at descending steps at home.  He states his legs became shaky when going down the steps at home.  This was likely due to soreness/DOMS from last session. He would benefit from continued skilled PT for hip ROM and flexibility as well as quad rehab for improved arthrokinematic which should reduce his left hip pain.    Personal Factors and Comorbidities Comorbidity 1    Comorbidities Hx lumbar fracture, Htn    Examination-Activity Limitations Bend;Lift;Transfers    Stability/Clinical Decision Making Stable/Uncomplicated    Clinical Decision Making Low    Rehab Potential Excellent    PT Frequency 1x / week    PT Duration 8 weeks    PT Treatment/Interventions ADLs/Self Care Home Management;Aquatic Therapy;Traction;Moist Heat;Iontophoresis 4mg /ml Dexamethasone;Electrical Stimulation;Cryotherapy;Ultrasound;Gait training;Therapeutic exercise;Therapeutic activities;Functional mobility training;Stair training;Balance training;Neuromuscular re-education;Patient/family education;Manual techniques;Passive range of motion;Taping;Energy conservation;Dry needling;Joint Manipulations    PT Next Visit Plan Progress hip strengthening, continue stretches.  Continue nu step and increase to level 5.    PT Home Exercise Plan Access Code: [email or web address]             Patient will benefit from skilled therapeutic intervention in order to improve the following deficits and impairments:  Abnormal gait, Decreased balance, Decreased mobility, Difficulty walking, Increased muscle spasms, Decreased range of motion, Pain, Decreased strength  Visit Diagnosis: Pain in left hip  Primary osteoarthritis of left hip  Stiffness of left hip, not elsewhere classified  Difficulty in walking, not elsewhere classified  Muscle weakness (generalized)     Problem List Patient Active Problem List   Diagnosis Date Noted   Stable angina (HCC) 06/08/2021   Chest pain  04/03/2021   Hearing loss 04/03/2021   Leg pain 04/03/2021   Atherosclerotic cardiovascular disease 04/27/2020  Aortic calcification (HCC) 04/27/2020   Emphysema lung (HCC) 04/25/2020   Pulmonary nodules 05/09/2018   Anemia 11/16/2017   BPH (benign prostatic hyperplasia) 03/08/2017   Hyperlipidemia 02/03/2017   Essential hypertension 01/24/2017   Tobacco abuse 01/24/2017   PHYSICAL THERAPY DISCHARGE SUMMARY  Visits from Start of Care: 4  Current functional level related to goals / functional outcomes: See above   Remaining deficits: See above   Education / Equipment: See above   Patient agrees to discharge. Patient goals were  Patient did not return after this visit.  Appears to have had surgery . Patient is being discharged due to not returning since the last visit.  Joe Shannon, PT 10/15/21 8:32 AM   [facility] @ [facility] [address] [place], [place], [address] Phone: [phone]   Fax:  [phone]  Name: Joe Shannon MRN: [id number] Date of Birth: 04-26-1951

## 2021-07-08 ENCOUNTER — Ambulatory Visit: Payer: PPO | Admitting: Cardiovascular Disease

## 2021-07-08 ENCOUNTER — Encounter: Payer: Self-pay | Admitting: Cardiovascular Disease

## 2021-07-08 ENCOUNTER — Other Ambulatory Visit: Payer: Self-pay

## 2021-07-08 VITALS — BP 150/84 | HR 75 | Ht 69.0 in | Wt 176.0 lb

## 2021-07-08 DIAGNOSIS — E785 Hyperlipidemia, unspecified: Secondary | ICD-10-CM

## 2021-07-08 DIAGNOSIS — J432 Centrilobular emphysema: Secondary | ICD-10-CM

## 2021-07-08 DIAGNOSIS — I25118 Atherosclerotic heart disease of native coronary artery with other forms of angina pectoris: Secondary | ICD-10-CM | POA: Diagnosis not present

## 2021-07-08 DIAGNOSIS — F17200 Nicotine dependence, unspecified, uncomplicated: Secondary | ICD-10-CM | POA: Diagnosis not present

## 2021-07-08 DIAGNOSIS — E782 Mixed hyperlipidemia: Secondary | ICD-10-CM | POA: Diagnosis not present

## 2021-07-08 DIAGNOSIS — I700 Atherosclerosis of aorta: Secondary | ICD-10-CM

## 2021-07-08 MED ORDER — ROSUVASTATIN CALCIUM 20 MG PO TABS: 20.0000 mg | ORAL_TABLET | Freq: Every day | ORAL | 3 refills | Status: DC

## 2021-07-08 MED ORDER — EZETIMIBE 10 MG PO TABS: 10.0000 mg | ORAL_TABLET | Freq: Every day | ORAL | 3 refills | Status: DC

## 2021-07-08 MED ORDER — DOXAZOSIN MESYLATE 1 MG PO TABS: 1.0000 mg | ORAL_TABLET | Freq: Every day | ORAL | 3 refills | Status: DC

## 2021-07-08 NOTE — Progress Notes (Signed)
Cardiology Office Note  Date:  07/08/2021   ID:  Joe Shannon, DOB 10/01/1950, MRN [id number]  PCP:  Holley Bouche, MD   Chief Complaint  Patient presents with   1 month follow up     "Doing well." Medications reviewed by the patient verbally.     HPI:  [name] is a 71 year old gentleman with past medical history of long smoking history Hyperlipidemia Hypertension Heavy coronary calcification on CT scan October 2021 Strong family history of coronary disease Who presents for f/u of his CAD  Last seen in clinic November 2022 Cardiac catheterization June 08, 2021 for anginal symptoms Showing mild to moderate nonobstructive coronary disease  In follow-up today reports that he continues to smoke around 1 pack/day   Recording his blood pressure at home, numbers continue to run high Taking amlodipine 10, lisinopril 40 Systolic pressures 683-4 50 on a regular basis  Having some muscle ache, etiology unclear  CT scan chest  heavy calcification of all 3 vessels  EKG personally reviewed by myself on todays visit Normal sinus rhythm rate 75 bpm no significant ST-T wave changes  Echo 10/22   1. Left ventricular ejection fraction, by estimation, is 55 to 60%. The  left ventricle has normal function. The left ventricle has no regional  wall motion abnormalities. Left ventricular diastolic parameters are  consistent with Grade I diastolic  dysfunction (impaired relaxation).   2. Right ventricular systolic function is normal. The right ventricular  size is normal.   3. Left atrial size was mildly dilated.   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  PSH:    Past Surgical History:  Procedure Laterality Date   COLONOSCOPY     HERNIA REPAIR     Ligunial hernia, both sides    LEFT HEART CATH AND CORONARY ANGIOGRAPHY N/A 06/08/2021   Procedure: LEFT HEART CATH AND CORONARY ANGIOGRAPHY;  Surgeon: Nelva Bush, MD;  Location: [facility];  Service: Cardiovascular;  Laterality: N/A;   VASECTOMY      Current Outpatient Medications  Medication Sig Dispense Refill   amLODipine (NORVASC) 10 MG tablet Take 1 tablet by mouth once daily 90 tablet 0   aspirin EC 81 MG tablet Take 1 tablet (81 mg total) by mouth daily. 30 tablet 4   atorvastatin (LIPITOR) 40 MG tablet Take 1 tablet by mouth once daily 90 tablet 0   lisinopril (ZESTRIL) 20 MG tablet Take 2 tablets (40 mg total) by mouth daily. 180 tablet 3   Multiple Vitamins-Minerals (CENTRUM SILVER 50+MEN) TABS Take 1 Syringe by mouth daily.     nicotine (EQ NICOTINE) 21 mg/24hr patch Place 1 patch (21 mg total) onto the skin daily. 28 patch 2   nitroGLYCERIN (NITROSTAT) 0.4 MG SL tablet Place 1 tablet (0.4 mg total) under the tongue every 5 (five) minutes as needed for chest pain. Then call 911 if you use this medication 30 tablet 1   vitamin B-12 (CYANOCOBALAMIN) 1000 MCG tablet Take 1,000  mcg by mouth daily.     Zoster Vaccine Adjuvanted [facility]) injection Inject 0.5 ml IM and Repeat in 2 months (Patient not taking: Reported on 06/02/2021) 0.5 mL 1   No current facility-administered medications for this visit.     Allergies:   Patient has no known allergies.   Social History:  The patient  reports that he has been smoking cigarettes. He started smoking about 52 years ago. He has a 48.00 pack-year smoking history. He has never used smokeless tobacco. He reports current alcohol use of about 4.0 - 5.0 standard drinks per week.  He reports that he does not use drugs.   Family History:   family history includes Colon cancer in his mother; Diabetes in his father; Heart disease in his brother; Heart disease (age of onset: 7) in his mother; Hyperlipidemia in his brother; Hypertension in his brother, father, and mother; Stroke (age of onset: 32) in his father.    Review of Systems: Review of Systems  Constitutional: Negative.   HENT: Negative.    Respiratory: Negative.    Cardiovascular:  Positive for chest pain.  Gastrointestinal: Negative.   Musculoskeletal: Negative.   Neurological: Negative.   Psychiatric/Behavioral: Negative.    All other systems reviewed and are negative.   PHYSICAL EXAM: VS:  BP (!) 150/84 (BP Location: Left Arm, Patient Position: Sitting, Cuff Size: Normal)    Pulse 75    Ht 5\' 9"  (1.753 m)    Wt 176 lb (79.8 kg)    SpO2 98%    BMI 25.99 kg/m  , BMI Body mass index is 25.99 kg/m. Constitutional:  oriented to person, place, and time. No distress.  HENT:  Head: Grossly normal Eyes:  no discharge. No scleral icterus.  Neck: No JVD, no carotid bruits  Cardiovascular: Regular rate and rhythm, no murmurs appreciated Pulmonary/Chest: Clear to auscultation bilaterally, no wheezes or rails Abdominal: Soft.  no distension.  no tenderness.  Musculoskeletal: Normal range of motion Neurological:  normal muscle tone. Coordination normal. No atrophy Skin: Skin warm and dry Psychiatric: normal affect, pleasant  Recent Labs: 06/02/2021: BUN 12; Creatinine, Ser 0.78; Hemoglobin 13.3; Platelets 302; Potassium 4.4; Sodium 136    Lipid Panel Lab Results  Component Value Date   CHOL 191 04/03/2021   HDL 63 04/03/2021   LDLCALC 110 (H) 04/03/2021   TRIG 103 04/03/2021      Wt Readings from Last 3 Encounters:  07/08/21 176 lb (79.8 kg)  06/08/21 175 lb (79.4 kg)  06/02/21 176 lb 8 oz (80.1 kg)     ASSESSMENT AND PLAN:  Problem List Items Addressed This Visit       Cardiology Problems    Aortic calcification (HCC)   Hyperlipidemia     Other   Emphysema lung ([place])   Other Visit Diagnoses     Coronary artery disease of native artery of native heart with stable angina pectoris ([place])    -  Primary   Smoker         Coronary disease with stable angina Nonobstructive disease noted on cardiac catheterization Reports as he cut back on his smoking his chest pain has improved --- Stressed importance of more aggressive lipid control and better blood pressure control  Essential hypertension HCTZ previously held for low sodium Continues lisinopril 40 amlodipine 10 Reports having some prostate issues, will add Cardura/doxazosin 1 mg in the evening This could be titrated upwards as needed for blood pressure control  Hyperlipidemia Given his leg pain, will change  to Crestor 20 Numbers are not at goal on Lipitor Goal LDL less than 70, preferably less than 60 We will add Zetia 10 daily If having myalgias on Crestor, may need PCSK9 inhibitor  COPD/emphysema Still smoking Strong recommendation to quit smoking   Total encounter time more than 25 minutes  Greater than 50% was spent in counseling and coordination of care with the patient   Signed, Esmond Plants, M.D., Ph.D. [place], [place]

## 2021-07-08 NOTE — Patient Instructions (Addendum)
Medication Instructions:  Please stop  atorvastatin, can cause muscle ache  [place] Start  crestor 20 mg  (for cholesterol) zetia 10 mg daily (for cholesterol)  cardura 1 mg daily (for blood pressure)  If you need a refill on your cardiac medications before your next appointment, please call your pharmacy.   Lab work: Lipid panel and LFTs in 3 months (office will call for an appt)  Testing/Procedures: No new testing needed  Follow-Up: At [facility], you and your health needs are our priority.  As part of our continuing mission to provide you with exceptional heart care, we have created designated Provider Care Teams.  These Care Teams include your primary Cardiologist (physician) and Advanced Practice Providers (APPs -  Physician Assistants and Nurse Practitioners) who all work together to provide you with the care you need, when you need it.  You will need a follow up appointment in 6 months  Providers on your designated Care Team:   Murray Hodgkins, NP Christell Faith, PA-C Cadence Kathlen Mody, [place]  COVID-19 Vaccine Information can be found at: ShippingScam.co.uk For questions related to vaccine distribution or appointments, please email [email or web address] or call 5873010198.

## 2021-07-24 NOTE — Addendum Note (Signed)
Addended by: Anselm Pancoast on: 07/24/2021 01:15 PM   Modules accepted: Orders

## 2021-07-30 ENCOUNTER — Other Ambulatory Visit: Payer: Self-pay | Admitting: Student

## 2021-07-30 ENCOUNTER — Other Ambulatory Visit: Payer: Self-pay

## 2021-07-30 DIAGNOSIS — I10 Essential (primary) hypertension: Secondary | ICD-10-CM

## 2021-07-30 DIAGNOSIS — E785 Hyperlipidemia, unspecified: Secondary | ICD-10-CM

## 2021-10-09 ENCOUNTER — Other Ambulatory Visit (INDEPENDENT_AMBULATORY_CARE_PROVIDER_SITE_OTHER): Payer: PPO

## 2021-10-09 DIAGNOSIS — E785 Hyperlipidemia, unspecified: Secondary | ICD-10-CM

## 2021-10-09 DIAGNOSIS — I25118 Atherosclerotic heart disease of native coronary artery with other forms of angina pectoris: Secondary | ICD-10-CM

## 2021-10-10 LAB — LIPID PANEL
Chol/HDL Ratio: 2.5 ratio (ref 0.0–5.0)
Cholesterol, Total: 146 mg/dL (ref 100–199)
HDL: 59 mg/dL (ref >39)
LDL Chol Calc (NIH): 74 mg/dL (ref 0–99)
Triglycerides: 63 mg/dL (ref 0–149)
VLDL Cholesterol Cal: 13 mg/dL (ref 5–40)

## 2021-10-10 LAB — HEPATIC FUNCTION PANEL
ALT: 24 IU/L (ref 0–44)
AST: 19 IU/L (ref 0–40)
Albumin: 4.5 g/dL (ref 3.8–4.8)
Alkaline Phosphatase: 70 IU/L (ref 44–121)
Bilirubin Total: 0.7 mg/dL (ref 0.0–1.2)
Bilirubin, Direct: 0.23 mg/dL (ref 0.00–0.40)
Total Protein: 7 g/dL (ref 6.0–8.5)

## 2021-10-12 ENCOUNTER — Telehealth: Payer: Self-pay | Admitting: Emergency Medicine

## 2021-10-12 NOTE — Telephone Encounter (Signed)
-----   Message from Minna Merritts, MD sent at 10/11/2021  1:45 PM EDT ----- ?Lab work reviewed ?Cholesterol better down to 146, previously 191 ?LDL also better 110 before, now 74 ?Normal liver function test ?

## 2021-10-12 NOTE — Telephone Encounter (Signed)
Called and spoke with patient. Results reviewed with patient, pt verbalized understanding,  questions (if any) answered.   ?

## 2022-01-14 NOTE — Progress Notes (Signed)
Cardiology Office Note  Date:  01/15/2022   ID:  GRANGER CHUI, DOB [birth year], MRN [id number]  PCP:  Holley Bouche, MD   Chief Complaint  Patient presents with   OTHER    6 Month f/u no complaints today. Meds reviewed verbally with pt.    HPI:  Mr. Cordelro Gautreau is a 71 year old gentleman with past medical history of long smoking history 1 ppd Hyperlipidemia Hypertension Heavy coronary calcification on CT scan October 2021 Strong family history of coronary disease Who presents for f/u of his CAD  Last seen by myself in clinic January 2023  Reports recent travel 08/10/21 in [place], developed abd pain Diagnosed with cecal volvulus, underwent urgent abdominal surgery Has since recovered, back to his baseline  Active, Denies chest pain concerning for angina, no significant shortness of breath on exertion  Reports that he continues to smoke 1 pack/day Does not want to try Chantix  Tolerating current medications including amlodipine, aspirin, Cardura, Zetia, lisinopril, Crestor 20  Cardiac catheterization June 08, 2021 for anginal symptoms Showing mild to moderate nonobstructive coronary disease  CT scan chest  heavy calcification of all 3 vessels  EKG personally reviewed by myself on todays visit Normal sinus rhythm rate 92 bpm no significant ST-T wave changes, PVC  Other past medical history reviewed Echo 10/22   1. Left ventricular ejection fraction, by estimation, is 55 to 60%. The  left ventricle has normal function. The left ventricle has no regional  wall motion abnormalities. Left ventricular diastolic parameters are  consistent with Grade I diastolic  dysfunction (impaired relaxation).   2. Right ventricular systolic function is normal. The right ventricular  size is normal.   3. Left atrial size was mildly dilated.   Family history brother has coronary disease, mother also with bypass   PMH:   has a past medical history of Closed lumbar  vertebral fracture ([place]).  PSH:    Past Surgical History:  Procedure Laterality Date   colon extraction     COLONOSCOPY     HERNIA REPAIR     Ligunial hernia, both sides    LEFT HEART CATH AND CORONARY ANGIOGRAPHY N/A 06/08/2021   Procedure: LEFT HEART CATH AND CORONARY ANGIOGRAPHY;  Surgeon: Nelva Bush, MD;  Location: [facility];  Service: Cardiovascular;  Laterality: N/A;   VASECTOMY      Current Outpatient Medications  Medication Sig Dispense Refill   amLODipine (NORVASC) 10 MG tablet Take 1 tablet by mouth once daily 90 tablet 1   aspirin EC 81 MG tablet Take 1 tablet (81 mg total) by mouth daily. 30 tablet 4   doxazosin (CARDURA) 1 MG tablet Take 1 tablet (1 mg total) by mouth daily. 90 tablet 3   ezetimibe (ZETIA) 10 MG tablet Take 1 tablet (10 mg total) by mouth daily. 90 tablet 3   lisinopril (ZESTRIL) 20 MG tablet Take 2 tablets (40 mg total) by mouth daily. 180 tablet 3   Multiple Vitamins-Minerals (CENTRUM SILVER 50+MEN) TABS Take 1 Syringe by mouth daily.     nitroGLYCERIN (NITROSTAT) 0.4 MG SL tablet Place 1 tablet (0.4 mg total) under the tongue every 5 (five) minutes as needed for chest pain. Then call 911 if you use this medication 30 tablet 1   rosuvastatin (CRESTOR) 20 MG tablet Take 1 tablet (20 mg total) by mouth daily. 90 tablet 3   vitamin B-12 (CYANOCOBALAMIN) 1000 MCG tablet Take 1,000 mcg by mouth daily.     Zoster Vaccine Adjuvanted (  SHINGRIX) injection Inject 0.5 ml IM and Repeat in 2 months 0.5 mL 1   No current facility-administered medications for this visit.    Allergies:   Patient has no known allergies.   Social History:  The patient  reports that he has been smoking cigarettes. He started smoking about 52 years ago. He has a 48.00 pack-year smoking history. He has never used smokeless tobacco. He reports current alcohol use of about 4.0 - 5.0 standard drinks of alcohol per week. He reports that he does not use drugs.   Family History:    family history includes Colon cancer in his mother; Diabetes in his father; Heart disease in his brother; Heart disease (age of onset: 73) in his mother; Hyperlipidemia in his brother; Hypertension in his brother, father, and mother; Stroke (age of onset: 90) in his father.    Review of Systems: Review of Systems  Constitutional: Negative.   HENT: Negative.    Respiratory: Negative.    Cardiovascular: Negative.   Gastrointestinal: Negative.   Musculoskeletal: Negative.   Neurological: Negative.   Psychiatric/Behavioral: Negative.    All other systems reviewed and are negative.  PHYSICAL EXAM: VS:  BP 120/80 (BP Location: Left Arm, Patient Position: Sitting, Cuff Size: Normal)   Pulse 92   Ht '5\' 9"'$  (1.753 m)   Wt 173 lb 6 oz (78.6 kg)   SpO2 98%   BMI 25.60 kg/m  , BMI Body mass index is 25.6 kg/m. Constitutional:  oriented to person, place, and time. No distress.  HENT:  Head: Grossly normal Eyes:  no discharge. No scleral icterus.  Neck: No JVD, no carotid bruits  Cardiovascular: Regular rate and rhythm, no murmurs appreciated Pulmonary/Chest: Clear to auscultation bilaterally, no wheezes or rails Abdominal: Soft.  no distension.  no tenderness.  Musculoskeletal: Normal range of motion Neurological:  normal muscle tone. Coordination normal. No atrophy Skin: Skin warm and dry Psychiatric: normal affect, pleasant  Recent Labs: 06/02/2021: BUN 12; Creatinine, Ser 0.78; Hemoglobin 13.3; Platelets 302; Potassium 4.4; Sodium 136 10/09/2021: ALT 24    Lipid Panel Lab Results  Component Value Date   CHOL 146 10/09/2021   HDL 59 10/09/2021   LDLCALC 74 10/09/2021   TRIG 63 10/09/2021      Wt Readings from Last 3 Encounters:  01/15/22 173 lb 6 oz (78.6 kg)  07/08/21 176 lb (79.8 kg)  06/08/21 175 lb (79.4 kg)     ASSESSMENT AND PLAN:  Problem List Items Addressed This Visit       Cardiology Problems   Aortic calcification (HCC)   Essential hypertension    Hyperlipidemia     Other   Emphysema lung ([place])   Other Visit Diagnoses     Coronary artery disease of native artery of native heart with stable angina pectoris ([place])    -  Primary   Smoker       Dyslipidemia       Unstable angina ([place])          Coronary disease with stable angina Nonobstructive disease noted on cardiac catheterization Smoking cessation recommended, cholesterol at goal  Essential hypertension HCTZ previously held for low sodium Continues lisinopril 40 amlodipine 10 Cardura/doxazosin 1 mg in the evening Blood pressure stable  Hyperlipidemia Cholesterol at goal on Crestor and Zetia  COPD/emphysema Still smoking Strong recommendation to quit smoking Declining Chantix  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 [place], [place]

## 2022-01-15 ENCOUNTER — Encounter: Payer: Self-pay | Admitting: Cardiovascular Disease

## 2022-01-15 ENCOUNTER — Ambulatory Visit: Payer: PPO | Admitting: Cardiovascular Disease

## 2022-01-15 VITALS — BP 120/80 | HR 92 | Ht 69.0 in | Wt 173.4 lb

## 2022-01-15 DIAGNOSIS — I10 Essential (primary) hypertension: Secondary | ICD-10-CM

## 2022-01-15 DIAGNOSIS — I25118 Atherosclerotic heart disease of native coronary artery with other forms of angina pectoris: Secondary | ICD-10-CM | POA: Diagnosis not present

## 2022-01-15 DIAGNOSIS — J432 Centrilobular emphysema: Secondary | ICD-10-CM | POA: Diagnosis not present

## 2022-01-15 DIAGNOSIS — F17200 Nicotine dependence, unspecified, uncomplicated: Secondary | ICD-10-CM

## 2022-01-15 DIAGNOSIS — E782 Mixed hyperlipidemia: Secondary | ICD-10-CM

## 2022-01-15 DIAGNOSIS — I700 Atherosclerosis of aorta: Secondary | ICD-10-CM

## 2022-01-15 DIAGNOSIS — E785 Hyperlipidemia, unspecified: Secondary | ICD-10-CM

## 2022-01-15 DIAGNOSIS — I200 Unstable angina: Secondary | ICD-10-CM

## 2022-01-15 NOTE — Patient Instructions (Signed)
Medication Instructions:  No changes  If you need a refill on your cardiac medications before your next appointment, please call your pharmacy.   Lab work: No new labs needed  Testing/Procedures: No new testing needed  Follow-Up: At [facility], you and your health needs are our priority.  As part of our continuing mission to provide you with exceptional heart care, we have created designated Provider Care Teams.  These Care Teams include your primary Cardiologist (physician) and Advanced Practice Providers (APPs -  Physician Assistants and Nurse Practitioners) who all work together to provide you with the care you need, when you need it.  You will need a follow up appointment in 12 months  Providers on your designated Care Team:   Christopher Berge, NP Ryan Dunn, PA-C Cadence Furth, PA-C  COVID-19 Vaccine Information can be found at: [email or web address] For questions related to vaccine distribution or appointments, please email [email or web address] or call [phone].   

## 2022-01-19 NOTE — Addendum Note (Signed)
Addended by: Britt Bottom on: 01/19/2022 09:48 AM   Modules accepted: Orders

## 2022-03-24 ENCOUNTER — Ambulatory Visit: Payer: PPO | Admitting: Student

## 2022-04-02 ENCOUNTER — Encounter: Payer: Self-pay | Admitting: Student

## 2022-04-02 ENCOUNTER — Ambulatory Visit (INDEPENDENT_AMBULATORY_CARE_PROVIDER_SITE_OTHER): Payer: PPO | Admitting: Student

## 2022-04-02 VITALS — BP 146/80 | HR 81 | Ht 69.0 in | Wt 178.8 lb

## 2022-04-02 DIAGNOSIS — I10 Essential (primary) hypertension: Secondary | ICD-10-CM | POA: Diagnosis not present

## 2022-04-02 DIAGNOSIS — Z131 Encounter for screening for diabetes mellitus: Secondary | ICD-10-CM

## 2022-04-02 DIAGNOSIS — Z23 Encounter for immunization: Secondary | ICD-10-CM

## 2022-04-02 DIAGNOSIS — J439 Emphysema, unspecified: Secondary | ICD-10-CM

## 2022-04-02 DIAGNOSIS — I2510 Atherosclerotic heart disease of native coronary artery without angina pectoris: Secondary | ICD-10-CM

## 2022-04-02 LAB — POCT GLYCOSYLATED HEMOGLOBIN (HGB A1C): Hemoglobin A1C: 5.2 % (ref 4.0–5.6)

## 2022-04-02 NOTE — Patient Instructions (Addendum)
It was great to see you! Thank you for allowing me to participate in your care!  I recommend that you always bring your medications to each appointment as this makes it easy to ensure we are on the correct medications and helps [place] not miss when refills are needed.  Our plans for today:  - High Blood Pressure   Follow up in 2 weeks to have your BP rechecked.  - CAD - doing well - Cholesterol - doing well - Tobacco - Continue to wean off cigarettes  - Diabetes - No concern at this time  Take care and seek immediate care sooner if you develop any concerns.   Dr. Holley Bouche, MD [place]

## 2022-04-02 NOTE — Assessment & Plan Note (Addendum)
Patient with CAD following with Cardiology. Patient denies any chest pain since surgery. Patient lipids nearly at goal, 74. Will continue Crestor 20 for now, and reassess need for increase next year.   -Continue cholesterol and BP meds -F/u w/ Cardiology

## 2022-04-02 NOTE — Progress Notes (Unsigned)
  SUBJECTIVE:   CHIEF COMPLAINT / HPI:   Flu shot and check up  DM: Checking A1c today  CAD Seen by Cards 07/08/21 w/ LHC showed non obstructive CAD, and they stressed smoking cessation and BP control. Not having chest pain, since he had the surgery for his volvulus. Last lipids 10/09/21 @ goal. Meds: Crestor 20, Zetia  The 10-year ASCVD risk score (Arnett DK, et al., 2019) is: 23.5%   Values used to calculate the score:     Age: 71 years     Sex: Male     Is Non-Hispanic African American: No     Diabetic: No     Tobacco smoker: Yes     Systolic Blood Pressure: 482 mmHg     Is BP treated: Yes     HDL Cholesterol: 59 mg/dL     Total Cholesterol: 146 mg/dL  HTN Meds: lisinopril 40, amlodipine 10, doxazosin 1 mg in the evening BP Readings from Last 3 Encounters:  04/02/22 (!) 146/80  01/15/22 120/80  07/08/21 (!) 150/84     Tobacco abuse/COPD Was working with Dr. Valentina Lucks, wants to manage on his own. 1/2 PPD with nicotine patches.    HM: Colon: Next due 07/2025 Flu Vacc: Today Pneumonia Vacc: Had PCV13 in 2019, declines PCV 20 today     PERTINENT  PMH / PSH: CAD, HTN, HLD   OBJECTIVE:  BP (!) 146/80   Pulse 81   Ht '5\' 9"'$  (1.753 m)   Wt 178 lb 12.8 oz (81.1 kg)   SpO2 99%   BMI 26.40 kg/m   General: NAD, pleasant, able to participate in exam Cardiac: RRR, no murmurs auscultated. Respiratory: CTAB, normal effort, no wheezes, rales or rhonchi Extremities: warm and well perfused, no edema or cyanosis, cap refill <2 sec Skin: warm and dry, no rashes noted Neuro: alert, no obvious focal deficits, speech normal Psych: Normal affect and mood  ASSESSMENT/PLAN:  Essential hypertension BP slightly elevated today with mixed elevated reads in past. Patient seen by cardiology who adjusted BP meds. Patient will need close f/u to determine need for adjustment in antihypertensive regimen.  -Continue Lisinopril, amlodipine, doxazosin -F/u 1-2 week for BP  check  Atherosclerotic cardiovascular disease Patient with CAD following with Cardiology. Patient denies any chest pain since surgery. Patient lipids nearly at goal, 74. Will continue Crestor 20 for now, and reassess need for increase next year.   -Continue cholesterol and BP meds -F/u w/ Cardiology   Emphysema lung ([place]) Noted on CT scan. Patient would benefit from PFTs, but declines. Patient continues to smoke, but is cutting down. Patient had negative low-dose CT scan last year, and would like repeat this year.  -low dose CT   Orders Placed This Encounter  Procedures   Flu Vaccine QUAD High Dose(Fluad)   HgB A1c   No orders of the defined types were placed in this encounter.  No follow-ups on file. '@SIGNNOTE'$ @

## 2022-04-02 NOTE — Assessment & Plan Note (Signed)
BP slightly elevated today with mixed elevated reads in past. Patient seen by cardiology who adjusted BP meds. Patient will need close f/u to determine need for adjustment in antihypertensive regimen.  -Continue Lisinopril, amlodipine, doxazosin -F/u 1-2 week for BP check

## 2022-04-06 NOTE — Assessment & Plan Note (Addendum)
Noted on CT scan. Patient would benefit from PFTs, but declines. Patient continues to smoke, but is cutting down. Patient had negative low-dose CT scan last year, and would like repeat this year.  -low dose CT

## 2022-04-10 ENCOUNTER — Other Ambulatory Visit: Payer: Self-pay | Admitting: Student

## 2022-04-10 DIAGNOSIS — I10 Essential (primary) hypertension: Secondary | ICD-10-CM

## 2022-04-19 ENCOUNTER — Inpatient Hospital Stay: Admission: RE | Admit: 2022-04-19 | Payer: PPO | Source: Ambulatory Visit

## 2022-04-23 ENCOUNTER — Other Ambulatory Visit: Payer: PPO

## 2022-04-28 ENCOUNTER — Ambulatory Visit: Payer: PPO

## 2022-07-08 ENCOUNTER — Other Ambulatory Visit: Payer: Self-pay | Admitting: Student

## 2022-07-08 DIAGNOSIS — I10 Essential (primary) hypertension: Secondary | ICD-10-CM

## 2022-07-12 ENCOUNTER — Other Ambulatory Visit: Payer: Self-pay | Admitting: Cardiovascular Disease

## 2022-07-12 DIAGNOSIS — I10 Essential (primary) hypertension: Secondary | ICD-10-CM

## 2022-07-22 ENCOUNTER — Other Ambulatory Visit: Payer: Self-pay | Admitting: Cardiovascular Disease

## 2022-10-08 ENCOUNTER — Other Ambulatory Visit: Payer: Self-pay | Admitting: Student

## 2022-10-08 DIAGNOSIS — I10 Essential (primary) hypertension: Secondary | ICD-10-CM

## 2022-10-21 ENCOUNTER — Other Ambulatory Visit: Payer: Self-pay | Admitting: Student

## 2022-10-21 ENCOUNTER — Encounter: Payer: Self-pay | Admitting: Student

## 2022-10-21 ENCOUNTER — Ambulatory Visit (INDEPENDENT_AMBULATORY_CARE_PROVIDER_SITE_OTHER): Payer: Medicare Other | Admitting: Student

## 2022-10-21 VITALS — BP 160/89 | HR 77 | Ht 69.0 in | Wt 183.2 lb

## 2022-10-21 DIAGNOSIS — I2510 Atherosclerotic heart disease of native coronary artery without angina pectoris: Secondary | ICD-10-CM

## 2022-10-21 DIAGNOSIS — E785 Hyperlipidemia, unspecified: Secondary | ICD-10-CM

## 2022-10-21 DIAGNOSIS — I10 Essential (primary) hypertension: Secondary | ICD-10-CM

## 2022-10-21 DIAGNOSIS — I2089 Other forms of angina pectoris: Secondary | ICD-10-CM | POA: Diagnosis not present

## 2022-10-21 DIAGNOSIS — J439 Emphysema, unspecified: Secondary | ICD-10-CM | POA: Diagnosis not present

## 2022-10-21 DIAGNOSIS — Z720 Tobacco use: Secondary | ICD-10-CM

## 2022-10-21 MED ORDER — SPIRONOLACTONE 25 MG PO TABS: 25.0000 mg | ORAL_TABLET | Freq: Every day | ORAL | 2 refills | Status: DC

## 2022-10-21 NOTE — Assessment & Plan Note (Signed)
Patient saw Cardiology July 2023 and is to f/u in 1 year. Encouraged patient to make an appointment to be seen in the next few months.  -F/u w/ Cardiology -Smoking cessation encouraged

## 2022-10-21 NOTE — Patient Instructions (Addendum)
It was great to see you! Thank you for allowing me to participate in your care!  I recommend that you always bring your medications to each appointment as this makes it easy to ensure we are on the correct medications and helps [place] not miss when refills are needed.  Our plans for today:  - Heart Disease - Coronary Artery Disease (CAD) Things are going fine, let's have you make an appointment w/ you cardiologist to be seen in July  -  Tobacco use  Reach out if you would like me to help assist you w/ quitting  - High Blood Pressure It's almost in range, but a little elevated. We will have to start a 3rd medication Take Spironolactone 25 mg at night, before bed. Continue lisinopril 40 mg, amlodipine 10 mg, doxazosin 1 mg in the evening Checking a BMP to make sure your electrolytes are good. Make appointment for Blood Pressure check in 2 weeks, want to be sure your not too low/high  -COPD  Noted on imaging study, but doesn't seem like you are having symptoms, so we will continue to watch  Bring/send me the paper/results from your Pulmonary Function Testing  -High Cholesterol  Checking your cholesterol today  Continue to take your crestor and zetia  We are checking some labs today, I will call you if they are abnormal will send you a MyChart message or a letter if they are normal.  If you do not hear about your labs in the next 2 weeks please let us know.  Take care and seek immediate care sooner if you develop any concerns.   Dr. Bess Kinds, MD [facility]

## 2022-10-21 NOTE — Assessment & Plan Note (Signed)
Reports compliance w/ meds. Will obtain lipid panel today -Lipid panel -Continue Zetia 10 mg and Crestor 20 mg

## 2022-10-21 NOTE — Assessment & Plan Note (Signed)
BP slightly elevated today, report's compliance w/ meds. Will initiate 3rd antihypertensive and have patient f/u for BP check - Continue lisinopril 40 mg, amlodipine 10 mg, doxazosin 1 mg in the evening - Start Spironolactone 25 mg daily - BMP - 2 wk f/u for BP check

## 2022-10-21 NOTE — Assessment & Plan Note (Signed)
Reports he's not had any symptoms since his bowel surgery. Has not used nitroglycerine, has been active doing labor w/o symptoms. -CTM

## 2022-10-21 NOTE — Progress Notes (Signed)
SUBJECTIVE:   CHIEF COMPLAINT / HPI:   HTN Meds: lisinopril 40 mg, amlodipine 10 mg, doxazosin 1 mg in the evening Reports good compliance w/ meds  HLD Lab Results  Component Value Date   CHOL 146 10/09/2021   HDL 59 10/09/2021   LDLCALC 74 10/09/2021   TRIG 63 10/09/2021   CHOLHDL 2.5 10/09/2021  Meds: Crestor 20 mg, Zetia 10 mg   CAD Last saw Card 01/15/22 Coronary disease with stable angina, nonobstructive disease noted on cardiac catheterization Smoking cessation recommended, cholesterol at goal   Tobacco abuse/COPD Was working with Dr. Raymondo Band, wants to manage on his own. 1/2 PPD with nicotine patches.  Down to 1/2 ppd. Is trying to quit, doesn't want to see Dr. Raymondo Band again.    COPD Noted Emphysema on CT chest 04/22/20 CT Chest ordered for lung cancer screen Was scheduled for for PFT testing in the past but heard he was going to have a 200$ copay. Has since switched insurance and would like to try again. 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   PERTINENT  PMH / PSH: HTN, CAD, COPD  Past Medical History:  Diagnosis Date   Closed lumbar vertebral fracture     Patient Care Team: Bess Kinds, MD as PCP - General (Family Medicine) OBJECTIVE:  BP (!) 160/89   Pulse 77   Ht  (1.753 m)   Wt 183 lb 3.2 oz (83.1 kg)   SpO2 98%   BMI 27.05 kg/m  Physical Exam Constitutional:      General: He is not in acute distress.    Appearance: Normal appearance. He is not ill-appearing.  Cardiovascular:     Rate and Rhythm: Normal rate and regular rhythm.     Pulses: Normal pulses.     Heart sounds: Normal heart sounds. No murmur heard.    No friction rub. No gallop.  Pulmonary:     Effort: Pulmonary effort is normal. No respiratory distress.     Breath sounds: Normal breath sounds. No stridor. No wheezing, rhonchi or rales.  Abdominal:     General: Bowel sounds are normal. There is no distension.      Palpations: Abdomen is soft.     Tenderness: There is no abdominal tenderness.  Neurological:     Mental Status: He is alert.      ASSESSMENT/PLAN:  Hyperlipidemia, unspecified hyperlipidemia type Assessment & Plan: Reports compliance w/ meds. Will obtain lipid panel today -Lipid panel -Continue Zetia 10 mg and Crestor 20 mg  Orders: -     Lipid panel  Essential hypertension Assessment & Plan: BP slightly elevated today, report's compliance w/ meds. Will initiate 3rd antihypertensive and have patient f/u for BP check - Continue lisinopril 40 mg, amlodipine 10 mg, doxazosin 1 mg in the evening - Start Spironolactone 25 mg daily - BMP - 2 wk f/u for BP check  Orders: -     Basic metabolic panel  Stable angina Assessment & Plan: Reports he's not had any symptoms since his bowel surgery. Has not used nitroglycerine, has been active doing labor w/o symptoms. -CTM   Pulmonary emphysema, unspecified emphysema type Assessment & Plan: Patient reports he had PFT done by someone who stopped by the house. Notes that it was normal and says he will bring in form. He is asymptomatic and denies any cough or SOB.  -CTM -Encourage smoking cessation   Tobacco abuse Assessment & Plan: Report's  he's down to 1/2 ppd. Does not want any help quitting at this time -CTM   Coronary artery disease involving native coronary artery of native heart without angina pectoris Assessment & Plan: Patient saw Cardiology July 2023 and is to f/u in 1 year. Encouraged patient to make an appointment to be seen in the next few months.  -F/u w/ Cardiology -Smoking cessation encouraged   Other orders -     Spironolactone; Take 1 tablet (25 mg total) by mouth at bedtime.  Dispense: 30 tablet; Refill: 2   No follow-ups on file. Bess Kinds, MD 10/21/2022, 2:09 PM PGY-2, [facility]

## 2022-10-21 NOTE — Assessment & Plan Note (Signed)
Report's he's down to 1/2 ppd. Does not want any help quitting at this time -CTM

## 2022-10-21 NOTE — Assessment & Plan Note (Signed)
Patient reports he had PFT done by someone who stopped by the house. Notes that it was normal and says he will bring in form. He is asymptomatic and denies any cough or SOB.  -CTM -Encourage smoking cessation

## 2022-10-22 ENCOUNTER — Telehealth: Payer: Self-pay | Admitting: Cardiovascular Disease

## 2022-10-22 ENCOUNTER — Encounter: Payer: Self-pay | Admitting: Student

## 2022-10-22 LAB — BASIC METABOLIC PANEL
BUN/Creatinine Ratio: 15 (ref 10–24)
BUN: 11 mg/dL (ref 8–27)
CO2: 20 mmol/L (ref 20–29)
Calcium: 9.3 mg/dL (ref 8.6–10.2)
Chloride: 99 mmol/L (ref 96–106)
Creatinine, Ser: 0.75 mg/dL — ABNORMAL LOW (ref 0.76–1.27)
Glucose: 83 mg/dL (ref 70–99)
Potassium: 4.4 mmol/L (ref 3.5–5.2)
Sodium: 136 mmol/L (ref 134–144)
eGFR: 97 mL/min/{1.73_m2} (ref >59)

## 2022-10-22 LAB — LIPID PANEL
Chol/HDL Ratio: 2.4 ratio (ref 0.0–5.0)
Cholesterol, Total: 143 mg/dL (ref 100–199)
HDL: 60 mg/dL (ref >39)
LDL Chol Calc (NIH): 69 mg/dL (ref 0–99)
Triglycerides: 68 mg/dL (ref 0–149)
VLDL Cholesterol Cal: 14 mg/dL (ref 5–40)

## 2022-10-22 NOTE — Telephone Encounter (Signed)
Patient would like to know if you are in agreement with him starting spironlactone  daily. He stated he saw pcp yesterday and added the medication because his blood pressure was high at his visit. He stated he wanted to consult with you before he started taking the spironolactone.

## 2022-10-22 NOTE — Telephone Encounter (Signed)
Pt c/o medication issue:  1. Name of Medication: spironolactone (ALDACTONE) 25 MG tablet   2. How are you currently taking this medication (dosage and times per day)? Not currently taking   3. Are you having a reaction (difficulty breathing--STAT)?   4. What is your medication issue? Patient is calling to make sure that medication given to him at his PCP doctor's office and he would like to know if this is something he should be taking. Please advise.

## 2022-10-25 MED ORDER — SPIRONOLACTONE 25 MG PO TABS: 25.0000 mg | ORAL_TABLET | Freq: Every day | ORAL | 2 refills | Status: AC

## 2022-10-25 NOTE — Telephone Encounter (Signed)
Patient has been made aware and will labs drawn at PCP.   Antonieta Iba, MD  [facility] [place] hours ago (10:49 AM)    Would closely monitor blood pressure at home 2 hours after he takes morning medications Spironolactone should be fine Needs to follow-up on lab work in 2 weeks ordered by primary care as sometimes potassium can run high Thx TGollan

## 2022-10-25 NOTE — Telephone Encounter (Signed)
Transmission to pharmacy failed on 4/19. Resent today and received confirmation from pharmacy at [phone].  Veronda Prude, RN

## 2022-10-25 NOTE — Addendum Note (Signed)
Addended by: Veronda Prude on: 10/25/2022 09:06 AM   Modules accepted: Orders

## 2022-11-01 ENCOUNTER — Telehealth: Payer: Self-pay | Admitting: Student

## 2022-11-01 NOTE — Telephone Encounter (Signed)
Called patient to schedule Medicare Annual Wellness Visit (AWV). Left message for patient to call back and schedule Medicare Annual Wellness Visit (AWV).  Last date of AWV:  AWVI eligible as of 09/02/2012   Please schedule an AWVI appointment at any time with [facility] VISIT.  If any questions, please contact me at [phone].    Thank you,  [facility] Support [facility] Direct dial  [phone]

## 2022-11-03 ENCOUNTER — Encounter: Payer: Self-pay | Admitting: Student

## 2022-11-03 ENCOUNTER — Other Ambulatory Visit: Payer: Self-pay

## 2022-11-03 ENCOUNTER — Ambulatory Visit (INDEPENDENT_AMBULATORY_CARE_PROVIDER_SITE_OTHER): Payer: Medicare Other | Admitting: Student

## 2022-11-03 VITALS — BP 139/85 | HR 86 | Ht 69.0 in | Wt 182.0 lb

## 2022-11-03 DIAGNOSIS — I10 Essential (primary) hypertension: Secondary | ICD-10-CM | POA: Diagnosis not present

## 2022-11-03 NOTE — Progress Notes (Signed)
  SUBJECTIVE:   CHIEF COMPLAINT / HPI:   HTN BP check  BP Readings from Last 3 Encounters:  11/03/22 139/85  10/21/22 (!) 160/89  04/02/22 (!) 146/80  Meds: Lisinopril 40 mg, amlodipine 10 mg, doxazosin 1 mg in the evening,  Spironolactone 25 mg daily BP at home ranging from 140/80 to 130/80, with about half the values being in high 130's- low 140's range.   PERTINENT  PMH / PSH: HTN   Patient Care Team: Bess Kinds, MD as PCP - General (Family Medicine) OBJECTIVE:  BP 139/85   Pulse 86   Ht 5\' 9"  (1.753 m)   Wt 182 lb (82.6 kg)   SpO2 98%   BMI 26.88 kg/m  Physical Exam Constitutional:      General: He is not in acute distress.    Appearance: Normal appearance. He is not ill-appearing.  Cardiovascular:     Rate and Rhythm: Normal rate and regular rhythm.     Pulses: Normal pulses.     Heart sounds: Normal heart sounds. No murmur heard.    No friction rub. No gallop.  Pulmonary:     Effort: Pulmonary effort is normal. No respiratory distress.     Breath sounds: Normal breath sounds. No wheezing or rhonchi.  Abdominal:     General: There is no distension.     Palpations: Abdomen is soft.     Tenderness: There is no abdominal tenderness.  Neurological:     Mental Status: He is alert.      ASSESSMENT/PLAN:  Essential hypertension Assessment & Plan: Patient comes in for BP check. BP at goal today, but nearly out of range on BP checks at home. Patient hesitant to start spironolactone / 3rd antihypertensive. Reassured patient and encouraged taking all three meds. Will have patient f/u in 2 wk for BP check and check K level. -Continue Lisinopril 40 mg, amlodipine 10 mg, doxazosin 1 mg in the evening,  Spironolactone 25 mg daily -BMP at next visit to assess for Hyperkalemia (lisinopril and spironolactone combo w/ increased risk)    No follow-ups on file. Bess Kinds, MD 11/03/2022, 12:21 PM PGY-2, [facility]

## 2022-11-03 NOTE — Patient Instructions (Signed)
It was great to see you! Thank you for allowing me to participate in your care!   Our plans for today:  - Start the spironolactone medication and take for 2 weeks - Check blood pressure 2-4 times a day - Make follow up appointment in 2 weeks  Rechecking Blood Pressure (Bring your log)  Checking labs for High Potassium     Take care and seek immediate care sooner if you develop any concerns.   Dr. Bess Kinds, MD [facility]

## 2022-11-03 NOTE — Assessment & Plan Note (Signed)
Patient comes in for BP check. BP at goal today, but nearly out of range on BP checks at home. Patient hesitant to start spironolactone / 3rd antihypertensive. Reassured patient and encouraged taking all three meds. Will have patient f/u in 2 wk for BP check and check K level. -Continue Lisinopril 40 mg, amlodipine 10 mg, doxazosin 1 mg in the evening,  Spironolactone 25 mg daily -BMP at next visit to assess for Hyperkalemia (lisinopril and spironolactone combo w/ increased risk)

## 2022-11-04 ENCOUNTER — Other Ambulatory Visit: Payer: Self-pay | Admitting: Cardiovascular Disease

## 2022-11-05 ENCOUNTER — Other Ambulatory Visit: Payer: Self-pay

## 2022-11-05 MED ORDER — DOXAZOSIN MESYLATE 1 MG PO TABS: 1.0000 mg | ORAL_TABLET | Freq: Every day | ORAL | 0 refills | Status: DC

## 2022-11-17 ENCOUNTER — Ambulatory Visit: Payer: Medicare Other | Admitting: Student

## 2022-11-30 ENCOUNTER — Ambulatory Visit: Payer: Medicare Other | Admitting: Student

## 2022-11-30 NOTE — Progress Notes (Deleted)
  SUBJECTIVE:   CHIEF COMPLAINT / HPI:   HTN Meds:  Lisinopril 40 mg, amlodipine 10 mg, doxazosin 1 mg in the evening, Spironolactone 25 mg daily   PERTINENT  PMH / PSH: ***  Past Medical History:  Diagnosis Date   Closed lumbar vertebral fracture [facility])     Patient Care Team: Bess Kinds, MD as PCP - General (Family Medicine) OBJECTIVE:  There were no vitals taken for this visit. Physical Exam   ASSESSMENT/PLAN:  There are no diagnoses linked to this encounter. No follow-ups on file. Bess Kinds, MD 11/30/2022, 7:13 AM PGY-***, [facility] Family Medicine {    This will disappear when note is signed, click to select method of visit    :1}

## 2022-12-01 ENCOUNTER — Telehealth: Payer: Self-pay | Admitting: Student

## 2022-12-01 NOTE — Telephone Encounter (Signed)
Contacted Joe Shannon to schedule their annual wellness visit. Patient declined to schedule AWV at this time.  Patient said he's having issues with his insurance.  He said he was charged $175 for his last office visit and was told to find another doctor.  Thank you,  [facility] Support [facility] Direct dial  [phone]

## 2023-01-04 ENCOUNTER — Other Ambulatory Visit: Payer: Self-pay | Admitting: Cardiovascular Disease

## 2023-01-04 ENCOUNTER — Other Ambulatory Visit: Payer: Self-pay | Admitting: Student

## 2023-01-04 DIAGNOSIS — I10 Essential (primary) hypertension: Secondary | ICD-10-CM

## 2023-01-04 NOTE — Telephone Encounter (Signed)
Good Morning,   Could you please schedule this patient a 12 month follow up appointment? The patient was last seen by Dr. Mariah Milling on 01-15-22. Thank you so much.

## 2023-01-04 NOTE — Telephone Encounter (Signed)
Pt scheduled on 9/3

## 2023-02-01 ENCOUNTER — Other Ambulatory Visit: Payer: Self-pay | Admitting: Cardiovascular Disease

## 2023-03-01 ENCOUNTER — Other Ambulatory Visit: Payer: Self-pay | Admitting: Cardiovascular Disease

## 2023-03-07 NOTE — Progress Notes (Unsigned)
Cardiology Office Note  Date:  03/08/2023   ID:  Joe Shannon, DOB 1951/04/21, MRN [id number]  PCP:  Carmelina Paddock, FNP   Chief Complaint  Patient presents with   12 month follow up     "Doing well." Medications reviewed by the patient verbally.     HPI:  [name] is a 71 year old gentleman with past medical history of long smoking history 1 ppd Hyperlipidemia Hypertension Heavy coronary calcification on CT scan October 2021 Strong family history of coronary disease Cardiac catheterization June 08, 2021 for anginal symptoms Showing mild to moderate nonobstructive coronary disease Who presents for f/u of his CAD  Last seen by myself in clinic July 2023 In follow-up reports doing well He has his blood pressure is well-controlled Reports taking amlodipine 10, Cardura 1 mg, lisinopril 40 daily, spironolactone 25 daily Reports no significant side effects  Continues smoking 1/2 ppd, previously declined Chantix Denies significant shortness of breath on exertion  [place] the beach later today  EKG personally reviewed by myself on todays visit EKG Interpretation Date/Time:  Tuesday March 08 2023 10:18:32 EDT Ventricular Rate:  75 PR Interval:  172 QRS Duration:  112 QT Interval:  378 QTC Calculation: 422 R Axis:   2  Text Interpretation: Normal sinus rhythm Incomplete right bundle branch block When compared with ECG of 08-Jun-2021 06:26, No significant change was found Confirmed by Julien Nordmann [phone]) on 03/08/2023 10:42:09 AM   Other past medical history reviewed travel 08/10/21 in [place], developed abd pain Diagnosed with cecal volvulus, underwent urgent abdominal surgery Has since recovered, back to his baseline  CT scan chest  heavy calcification of all 3 vessels  Echo 10/22   1. Left ventricular ejection fraction, by estimation, is 55 to 60%. The  left ventricle has normal function. The left ventricle has no regional  wall  motion abnormalities. Left ventricular diastolic parameters are  consistent with Grade I diastolic  dysfunction (impaired relaxation).   2. Right ventricular systolic function is normal. The right ventricular  size is normal.   3. Left atrial size was mildly dilated.   Family history brother has coronary disease, mother also with bypass   PMH:   has a past medical history of Closed lumbar vertebral fracture (HCC).  PSH:    Past Surgical History:  Procedure Laterality Date   colon extraction     COLONOSCOPY     HERNIA REPAIR     Ligunial hernia, both sides    LEFT HEART CATH AND CORONARY ANGIOGRAPHY N/A 06/08/2021   Procedure: LEFT HEART CATH AND CORONARY ANGIOGRAPHY;  Surgeon: Yvonne Kendall, MD;  Location: [facility] INVASIVE CV LAB;  Service: Cardiovascular;  Laterality: N/A;   VASECTOMY      Current Outpatient Medications  Medication Sig Dispense Refill   amLODipine (NORVASC) 10 MG tablet Take 1 tablet (10 mg total) by mouth daily. MAKE APPOINTMENT 90 tablet 0   aspirin EC 81 MG tablet Take 1 tablet (81 mg total) by mouth daily. 30 tablet 4   doxazosin (CARDURA) 1 MG tablet Take 1 tablet (1 mg total) by mouth daily. 90 tablet 0   ezetimibe (ZETIA) 10 MG tablet Take 1 tablet (10 mg total) by mouth daily. PLEASE SCHEDULE OFFICE VISIT FOR FURTHER REFILLS. THANK YOU! 90 tablet 0   lisinopril (ZESTRIL) 20 MG tablet Take 2 tablets by mouth once daily 180 tablet 0   Multiple Vitamins-Minerals (CENTRUM SILVER 50+MEN) TABS Take 1 Syringe by mouth daily.  rosuvastatin (CRESTOR) 20 MG tablet Take 1 tablet (20 mg total) by mouth daily. 90 tablet 0   spironolactone (ALDACTONE) 25 MG tablet Take 1 tablet (25 mg total) by mouth at bedtime. 30 tablet 2   vitamin B-12 (CYANOCOBALAMIN) 1000 MCG tablet Take 1,000 mcg by mouth daily.     nitroGLYCERIN (NITROSTAT) 0.4 MG SL tablet Place 1 tablet (0.4 mg total) under the tongue every 5 (five) minutes as needed for chest pain. Then call 911 if you use  this medication (Patient not taking: Reported on 03/08/2023) 30 tablet 1   Zoster Vaccine Adjuvanted [facility]) injection Inject 0.5 ml IM and Repeat in 2 months (Patient not taking: Reported on 03/08/2023) 0.5 mL 1   No current facility-administered medications for this visit.    Allergies:   Patient has no known allergies.   Social History:  The patient  reports that he has been smoking cigarettes. He started smoking about 53 years ago. He has a 53.7 pack-year smoking history. He has never used smokeless tobacco. He reports current alcohol use of about 4.0 - 5.0 standard drinks of alcohol per week. He reports that he does not use drugs.   Family History:   family history includes Colon cancer in his mother; Diabetes in his father; Heart disease in his brother; Heart disease (age of onset: 62) in his mother; Hyperlipidemia in his brother; Hypertension in his brother, father, and mother; Stroke (age of onset: 87) in his father.    Review of Systems: Review of Systems  Constitutional: Negative.   HENT: Negative.    Respiratory: Negative.    Cardiovascular: Negative.   Gastrointestinal: Negative.   Musculoskeletal: Negative.   Neurological: Negative.   Psychiatric/Behavioral: Negative.    All other systems reviewed and are negative.  PHYSICAL EXAM: VS:  BP 130/70 (BP Location: Left Arm, Patient Position: Sitting, Cuff Size: Normal)   Pulse 75   Ht 5\' 8"  (1.727 m)   Wt 185 lb 2 oz (84 kg)   SpO2 97%   BMI 28.15 kg/m  , BMI Body mass index is 28.15 kg/m. Constitutional:  oriented to person, place, and time. No distress.  HENT:  Head: Grossly normal Eyes:  no discharge. No scleral icterus.  Neck: No JVD, no carotid bruits  Cardiovascular: Regular rate and rhythm, no murmurs appreciated Pulmonary/Chest: Clear to auscultation bilaterally, no wheezes or rails Abdominal: Soft.  no distension.  no tenderness.  Musculoskeletal: Normal range of motion Neurological:  normal muscle tone.  Coordination normal. No atrophy Skin: Skin warm and dry Psychiatric: normal affect, pleasant   Recent Labs: 10/21/2022: BUN 11; Creatinine, Ser 0.75; Potassium 4.4; Sodium 136    Lipid Panel Lab Results  Component Value Date   CHOL 143 10/21/2022   HDL 60 10/21/2022   LDLCALC 69 10/21/2022   TRIG 68 10/21/2022      Wt Readings from Last 3 Encounters:  03/08/23 185 lb 2 oz (84 kg)  11/03/22 182 lb (82.6 kg)  10/21/22 183 lb 3.2 oz (83.1 kg)     ASSESSMENT AND PLAN:  Problem List Items Addressed This Visit       Cardiology Problems   Essential hypertension   Relevant Orders   EKG 12-Lead (Completed)   Hyperlipidemia   CAD (coronary artery disease) - Primary   Relevant Orders   EKG 12-Lead (Completed)   Aortic calcification (HCC)   Relevant Orders   EKG 12-Lead (Completed)     Other   Tobacco abuse   Emphysema lung (  HCC)   Relevant Orders   EKG 12-Lead (Completed)   Other Visit Diagnoses     Smoker       Dyslipidemia           Coronary disease with stable angina Denies chest pain concerning for angina Nonobstructive disease noted on cardiac catheterization 2022 Smoking cessation recommended, cholesterol at goal No further workup needed at this time  Essential hypertension HCTZ previously held for low sodium Continues lisinopril 40 amlodipine 10 Cardura/doxazosin 1 mg in the evening, spironolactone 25 Blood pressure within good range  Hyperlipidemia Cholesterol at goal on Crestor and Zetia LDL at goal  COPD/emphysema Still smoking 1/2 pack/day Strong recommendation to quit smoking Declining Chantix  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 [facility] [place], [place] [phone]

## 2023-03-08 ENCOUNTER — Encounter: Payer: Self-pay | Admitting: Cardiovascular Disease

## 2023-03-08 ENCOUNTER — Ambulatory Visit: Payer: Medicare Other | Attending: Cardiovascular Disease | Admitting: Cardiovascular Disease

## 2023-03-08 VITALS — BP 130/70 | HR 75 | Ht 68.0 in | Wt 185.1 lb

## 2023-03-08 DIAGNOSIS — I700 Atherosclerosis of aorta: Secondary | ICD-10-CM

## 2023-03-08 DIAGNOSIS — F17200 Nicotine dependence, unspecified, uncomplicated: Secondary | ICD-10-CM | POA: Diagnosis not present

## 2023-03-08 DIAGNOSIS — E782 Mixed hyperlipidemia: Secondary | ICD-10-CM

## 2023-03-08 DIAGNOSIS — Z720 Tobacco use: Secondary | ICD-10-CM

## 2023-03-08 DIAGNOSIS — I25118 Atherosclerotic heart disease of native coronary artery with other forms of angina pectoris: Secondary | ICD-10-CM | POA: Diagnosis not present

## 2023-03-08 DIAGNOSIS — E785 Hyperlipidemia, unspecified: Secondary | ICD-10-CM

## 2023-03-08 DIAGNOSIS — I10 Essential (primary) hypertension: Secondary | ICD-10-CM

## 2023-03-08 DIAGNOSIS — J432 Centrilobular emphysema: Secondary | ICD-10-CM

## 2023-03-08 MED ORDER — ROSUVASTATIN CALCIUM 20 MG PO TABS: 20.0000 mg | ORAL_TABLET | Freq: Every day | ORAL | 3 refills | Status: AC

## 2023-03-08 MED ORDER — LISINOPRIL 20 MG PO TABS
40.0000 mg | ORAL_TABLET | Freq: Every day | ORAL | 3 refills | Status: DC
Start: 2023-03-08 — End: 2024-04-23

## 2023-03-08 MED ORDER — EZETIMIBE 10 MG PO TABS: 10.0000 mg | ORAL_TABLET | Freq: Every day | ORAL | 3 refills | Status: AC

## 2023-03-08 MED ORDER — DOXAZOSIN MESYLATE 1 MG PO TABS: 1.0000 mg | ORAL_TABLET | Freq: Every day | ORAL | 3 refills | Status: DC

## 2023-03-08 NOTE — Patient Instructions (Signed)

## 2023-04-03 ENCOUNTER — Other Ambulatory Visit: Payer: Self-pay | Admitting: Student

## 2023-04-03 DIAGNOSIS — I10 Essential (primary) hypertension: Secondary | ICD-10-CM

## 2023-04-13 ENCOUNTER — Other Ambulatory Visit: Payer: Self-pay | Admitting: Student

## 2023-06-03 ENCOUNTER — Other Ambulatory Visit: Payer: Self-pay | Admitting: Student

## 2023-06-14 ENCOUNTER — Other Ambulatory Visit: Payer: Self-pay | Admitting: Student

## 2023-06-20 ENCOUNTER — Other Ambulatory Visit: Payer: Self-pay | Admitting: Student

## 2024-04-02 ENCOUNTER — Other Ambulatory Visit: Payer: Self-pay | Admitting: Cardiovascular Disease

## 2024-04-23 ENCOUNTER — Other Ambulatory Visit: Payer: Self-pay | Admitting: Cardiovascular Disease

## 2024-04-23 DIAGNOSIS — I10 Essential (primary) hypertension: Secondary | ICD-10-CM

## 2024-04-26 MED ORDER — LISINOPRIL 20 MG PO TABS: 40.0000 mg | ORAL_TABLET | Freq: Every day | ORAL | 0 refills | Status: DC

## 2024-07-02 ENCOUNTER — Other Ambulatory Visit: Payer: Self-pay | Admitting: Cardiovascular Disease

## 2024-07-02 DIAGNOSIS — I10 Essential (primary) hypertension: Secondary | ICD-10-CM
